# Patient Record
Sex: Female | Born: 1990 | Race: Black or African American | Hispanic: No | Marital: Single | Smoking: Current every day smoker
Health system: Southern US, Community
[De-identification: ages and names within clinical notes are randomized; demographics above are authoritative.]

## PROBLEM LIST (undated history)

## (undated) ENCOUNTER — Inpatient Hospital Stay (HOSPITAL_COMMUNITY): Payer: Self-pay

## (undated) DIAGNOSIS — T7840XA Allergy, unspecified, initial encounter: Secondary | ICD-10-CM

## (undated) DIAGNOSIS — O24419 Gestational diabetes mellitus in pregnancy, unspecified control: Secondary | ICD-10-CM

## (undated) HISTORY — DX: Gestational diabetes mellitus in pregnancy, unspecified control: O24.419

## (undated) HISTORY — DX: Allergy, unspecified, initial encounter: T78.40XA

---

## 2010-07-02 ENCOUNTER — Emergency Department: Payer: Self-pay | Admitting: Internal Medicine

## 2012-02-20 ENCOUNTER — Emergency Department: Payer: Self-pay | Admitting: Emergency Medicine

## 2014-09-03 ENCOUNTER — Emergency Department: Payer: Self-pay | Admitting: Emergency Medicine

## 2014-09-03 ENCOUNTER — Emergency Department: Payer: Self-pay | Admitting: General Practice

## 2014-09-09 ENCOUNTER — Emergency Department: Payer: Self-pay | Admitting: Emergency Medicine

## 2015-04-11 ENCOUNTER — Emergency Department: Admit: 2015-04-11 | Payer: Self-pay | Admitting: General Practice

## 2015-12-18 HISTORY — PX: WISDOM TOOTH EXTRACTION: SHX21

## 2016-02-01 ENCOUNTER — Ambulatory Visit (INDEPENDENT_AMBULATORY_CARE_PROVIDER_SITE_OTHER): Payer: BLUE CROSS/BLUE SHIELD | Admitting: Physician Assistant

## 2016-02-01 VITALS — BP 110/58 | HR 77 | Temp 98.3°F | Resp 14 | Ht 65.0 in | Wt 201.2 lb

## 2016-02-01 DIAGNOSIS — B309 Viral conjunctivitis, unspecified: Secondary | ICD-10-CM | POA: Diagnosis not present

## 2016-02-01 DIAGNOSIS — A599 Trichomoniasis, unspecified: Secondary | ICD-10-CM | POA: Diagnosis not present

## 2016-02-01 DIAGNOSIS — N898 Other specified noninflammatory disorders of vagina: Secondary | ICD-10-CM

## 2016-02-01 DIAGNOSIS — R350 Frequency of micturition: Secondary | ICD-10-CM

## 2016-02-01 LAB — POCT URINALYSIS DIP (MANUAL ENTRY)
Bilirubin, UA: NEGATIVE
Glucose, UA: NEGATIVE
Ketones, POC UA: NEGATIVE
Nitrite, UA: NEGATIVE
Protein Ur, POC: NEGATIVE
Spec Grav, UA: 1.025
Urobilinogen, UA: 0.2
pH, UA: 6

## 2016-02-01 LAB — POCT WET + KOH PREP
Yeast by KOH: ABSENT
Yeast by wet prep: ABSENT

## 2016-02-01 LAB — POC MICROSCOPIC URINALYSIS (UMFC): Trichomonas, UA: POSITIVE

## 2016-02-01 LAB — POCT URINE PREGNANCY: Preg Test, Ur: NEGATIVE

## 2016-02-01 LAB — HIV ANTIBODY (ROUTINE TESTING W REFLEX): HIV 1&2 Ab, 4th Generation: NONREACTIVE

## 2016-02-01 MED ORDER — POLYMYXIN B-TRIMETHOPRIM 10000-0.1 UNIT/ML-% OP SOLN: 1.0000 [drp] | OPHTHALMIC | Status: DC

## 2016-02-01 MED ORDER — METRONIDAZOLE 500 MG PO TABS: 2000.0000 mg | ORAL_TABLET | Freq: Three times a day (TID) | ORAL | Status: DC

## 2016-02-01 NOTE — Progress Notes (Signed)
02/01/2016 10:34 AM   DOB: 1991-03-18 / MRN: [id number]  SUBJECTIVE:  Linda Reyes is a 25 y.o. female presenting for vaginal discharge that started 4 days ago, report the discharge is whitish and associates vaginal itching.  Has had BV and yeast before.  Has a new sexual partner and reports the condom did not fail.  She is not taking birth control and wants to have a baby.  Complains that her eye has become red starting yesterday and has had copious tearing.  Denies photophobia, nausea, changes in vision, HA.  No facial tenderness.  Feels this problem is getting better.  Has tried Visine.  She wears contacts however has taken these out since the onset of this illness.       She has No Known Allergies.   She  has a past medical history of Allergy.    She  reports that she has been smoking.  She does not have any smokeless tobacco history on file. She  has no sexual activity history on file. The patient  has no past surgical history on file.  Her family history is not on file.  Review of Systems  Constitutional: Negative for fever and chills.  Eyes: Negative for blurred vision.  Respiratory: Negative for cough and shortness of breath.   Cardiovascular: Negative for chest pain.  Gastrointestinal: Negative for nausea and abdominal pain.  Genitourinary: Positive for frequency. Negative for dysuria, urgency, hematuria and flank pain.  Musculoskeletal: Negative for myalgias.  Skin: Negative for rash.  Neurological: Negative for dizziness, tingling and headaches.  Psychiatric/Behavioral: Negative for depression. The patient is not nervous/anxious.     Problem list and medications reviewed and updated by myself where necessary, and exist elsewhere in the encounter.   OBJECTIVE:  BP 110/58 mmHg  Pulse 77  Temp(Src) 98.3 F (36.8 C) (Oral)  Resp 14  Ht  (1.651 m)  Wt 201 lb 3.2 oz (91.264 kg)  BMI 33.48 kg/m2  SpO2 97%  LMP 01/29/2016  Physical Exam  Constitutional: She is  oriented to person, place, and time. She appears well-nourished. No distress.  Eyes: EOM are normal. Pupils are equal, round, and reactive to light.    Cardiovascular: Normal rate.   Pulmonary/Chest: Effort normal.  Abdominal: She exhibits no distension.  Genitourinary: Vagina normal. Cervix exhibits discharge. Cervix exhibits no motion tenderness and no friability.    Neurological: She is alert and oriented to person, place, and time. No cranial nerve deficit. Gait normal.  Skin: Skin is dry. She is not diaphoretic.  Psychiatric: She has a normal mood and affect.  Vitals reviewed.   Results for orders placed or performed in visit on 02/01/16 (from the past 72 hour(s))  POCT urine pregnancy     Status: None   Collection Time: 02/01/16 10:05 AM  Result Value Ref Range   Preg Test, Ur Negative Negative  POCT Microscopic Urinalysis ([facility])     Status: Abnormal   Collection Time: 02/01/16 10:05 AM  Result Value Ref Range   WBC,UR,HPF,POC Many (A) None WBC/hpf   RBC,UR,HPF,POC Few (A) None RBC/hpf   Bacteria Few (A) None, Too numerous to count   Mucus Present (A) Absent   Epithelial Cells, UR Per Microscopy Few (A) None, Too numerous to count cells/hpf   Trichomonas, UA Positive   POCT urinalysis dipstick     Status: Abnormal   Collection Time: 02/01/16 10:05 AM  Result Value Ref Range   Color, UA yellow yellow   Clarity, UA  clear clear   Glucose, UA negative negative   Bilirubin, UA negative negative   Ketones, POC UA negative negative   Spec Grav, UA 1.025    Blood, UA small (A) negative   pH, UA 6.0    Protein Ur, POC negative negative   Urobilinogen, UA 0.2    Nitrite, UA Negative Negative   Leukocytes, UA small (1+) (A) Negative  POCT Wet + KOH Prep     Status: None   Collection Time: 02/01/16 10:26 AM  Result Value Ref Range   Yeast by KOH Absent Present, Absent   Yeast by wet prep Absent Present, Absent   WBC by wet prep Few None, Few, Too numerous to count   Clue  Cells Wet Prep HPF POC None None, Too numerous to count   Trich by wet prep Present Present, Absent   Bacteria Wet Prep HPF POC Few None, Few, Too numerous to count   Epithelial Cells By [employer] ([facility]) Few None, Few, Too numerous to count   RBC,UR,HPF,POC None None RBC/hpf    No results found.  ASSESSMENT AND PLAN  Linda Reyes was seen today for conjunctivitis, urinary frequency and vaginal discharge.  Diagnoses and all orders for this visit:  Urinary frequency -     POCT urine pregnancy -     POCT Microscopic Urinalysis ([facility]) -     POCT urinalysis dipstick  Vaginal discharge -     POCT Wet + KOH Prep -     HIV antibody -     RPR -     Pap IG, CT/NG w/ reflex HPV when ASC-U  Viral conjunctivitis: Her vision is 20/40 without correction by my exam. Her symptoms are improving without therapy.  Advised she give this more time to go away on its own.  Advised she keep her contacts out and to start abx in 48 hours if her symptoms do not resolve.  RTC ASAP if her symptoms worsen.   -     trimethoprim-polymyxin b (POLYTRIM) ophthalmic solution; Place 1 drop into the left eye every 4 (four) hours.  Trichomonas vaginalis infection: Pharmacy notified to change sig to take 4 pills once.  -     metroNIDAZOLE (FLAGYL) 500 MG tablet; Take 4 tablets (2,000 mg total) by mouth 3 (three) times daily.    The patient was advised to call or return to clinic if she does not see an improvement in symptoms or to seek the care of the closest emergency department if she worsens with the above plan.   Deliah Boston, MHS, PA-C [facility] 02/01/2016 10:34 AM

## 2016-02-02 ENCOUNTER — Encounter: Payer: Self-pay | Admitting: *Deleted

## 2016-02-02 LAB — PAP IG, CT-NG, RFX HPV ASCU
Chlamydia Probe Amp: NOT DETECTED
GC Probe Amp: NOT DETECTED

## 2016-02-02 LAB — RPR

## 2016-09-19 ENCOUNTER — Ambulatory Visit: Payer: BLUE CROSS/BLUE SHIELD

## 2016-11-24 ENCOUNTER — Ambulatory Visit (HOSPITAL_COMMUNITY)
Admission: EM | Admit: 2016-11-24 | Discharge: 2016-11-24 | Disposition: A | Discharge status: Other Acute Inpatient Hospital | Payer: BLUE CROSS/BLUE SHIELD

## 2017-05-19 ENCOUNTER — Encounter (HOSPITAL_COMMUNITY): Payer: Self-pay | Admitting: Emergency Medicine

## 2017-05-19 ENCOUNTER — Emergency Department (HOSPITAL_COMMUNITY): Payer: Medicaid Other

## 2017-05-19 ENCOUNTER — Emergency Department (HOSPITAL_COMMUNITY)
Admission: EM | Admit: 2017-05-19 | Discharge: 2017-05-19 | Disposition: A | Discharge status: Home / Self Care | Payer: Medicaid Other | Attending: Emergency Medicine | Admitting: Emergency Medicine

## 2017-05-19 DIAGNOSIS — Z87891 Personal history of nicotine dependence: Secondary | ICD-10-CM | POA: Insufficient documentation

## 2017-05-19 DIAGNOSIS — Z3490 Encounter for supervision of normal pregnancy, unspecified, unspecified trimester: Secondary | ICD-10-CM

## 2017-05-19 DIAGNOSIS — O26891 Other specified pregnancy related conditions, first trimester: Secondary | ICD-10-CM | POA: Diagnosis not present

## 2017-05-19 DIAGNOSIS — Z3A00 Weeks of gestation of pregnancy not specified: Secondary | ICD-10-CM | POA: Insufficient documentation

## 2017-05-19 DIAGNOSIS — R1030 Lower abdominal pain, unspecified: Secondary | ICD-10-CM

## 2017-05-19 LAB — COMPREHENSIVE METABOLIC PANEL
ALT: 9 U/L — ABNORMAL LOW (ref 14–54)
AST: 16 U/L (ref 15–41)
Albumin: 3.6 g/dL (ref 3.5–5.0)
Alkaline Phosphatase: 56 U/L (ref 38–126)
Anion gap: 10 (ref 5–15)
BUN: 5 mg/dL — ABNORMAL LOW (ref 6–20)
CO2: 22 mmol/L (ref 22–32)
Calcium: 9.5 mg/dL (ref 8.9–10.3)
Chloride: 99 mmol/L — ABNORMAL LOW (ref 101–111)
Creatinine, Ser: 0.67 mg/dL (ref 0.44–1.00)
GFR calc Af Amer: >60 mL/min (ref >60)
GFR calc non Af Amer: >60 mL/min (ref >60)
Glucose, Bld: 105 mg/dL — ABNORMAL HIGH (ref 65–99)
Potassium: 3.7 mmol/L (ref 3.5–5.1)
Sodium: 131 mmol/L — ABNORMAL LOW (ref 135–145)
Total Bilirubin: 1 mg/dL (ref 0.3–1.2)
Total Protein: 7.4 g/dL (ref 6.5–8.1)

## 2017-05-19 LAB — URINALYSIS, ROUTINE W REFLEX MICROSCOPIC
Bilirubin Urine: NEGATIVE
Glucose, UA: NEGATIVE mg/dL
Hgb urine dipstick: NEGATIVE
Ketones, ur: NEGATIVE mg/dL
Nitrite: NEGATIVE
Protein, ur: NEGATIVE mg/dL
Specific Gravity, Urine: 1.009 (ref 1.005–1.030)
pH: 7 (ref 5.0–8.0)

## 2017-05-19 LAB — CBC
HCT: 40.4 % (ref 36.0–46.0)
Hemoglobin: 14.1 g/dL (ref 12.0–15.0)
MCH: 31.4 pg (ref 26.0–34.0)
MCHC: 34.9 g/dL (ref 30.0–36.0)
MCV: 90 fL (ref 78.0–100.0)
Platelets: 322 10*3/uL (ref 150–400)
RBC: 4.49 MIL/uL (ref 3.87–5.11)
RDW: 11.8 % (ref 11.5–15.5)
WBC: 9.7 10*3/uL (ref 4.0–10.5)

## 2017-05-19 LAB — LIPASE, BLOOD: Lipase: 23 U/L (ref 11–51)

## 2017-05-19 LAB — HCG, QUANTITATIVE, PREGNANCY: hCG, Beta Chain, Quant, S: 92321 m[IU]/mL — ABNORMAL HIGH (ref <5)

## 2017-05-19 MED ORDER — ACETAMINOPHEN 500 MG PO TABS
1000.0000 mg | ORAL_TABLET | Freq: Once | ORAL | Status: AC
  Administered 2017-05-19: 1000 mg via ORAL
  Filled 2017-05-19: qty 2

## 2017-05-19 NOTE — ED Triage Notes (Signed)
Reports nausea and vomiting X 1 started 3 days ago with abdominal pain on left side.  Reports that she may be pregnant.  Last period April 11th.

## 2017-05-19 NOTE — ED Notes (Signed)
Pt returns from ultrasound

## 2017-05-19 NOTE — ED Provider Notes (Signed)
[facility]-EMERGENCY DEPT Provider Note   CSN: [id number] Arrival date & time: 05/19/17  0546     History   Chief Complaint Chief Complaint  Patient presents with  . Emesis  . Abdominal Pain    HPI Linda Reyes is a 26 y.o. female.  HPI Patient presents to the emergency department with lower abdominal discomfort.  The patient states that she is concerned she may be pregnant.  She took 3 home pregnancy tests were positive.  The patient states her last normal period was in April.  The patient states that nothing seems make the condition better or worse.  She states that she has also had intermittent vomiting. The patient denies chest pain, shortness of breath, headache,blurred vision, neck pain, fever, cough, weakness, numbness, dizziness, anorexia, edema,diarrhea, rash, back pain, dysuria, hematemesis, bloody stool, near syncope, or syncope. Past Medical History:  Diagnosis Date  . Allergy     There are no active problems to display for this patient.   History reviewed. No pertinent surgical history.  OB History    No data available       Home Medications    Prior to Admission medications   Not on File    Family History No family history on file.  Social History Social History  Substance Use Topics  . Smoking status: Former Smoker    Quit date: 05/13/2017  . Smokeless tobacco: Never Used  . Alcohol use 0.0 oz/week     Comment: occasionally     Allergies   Patient has no known allergies.   Review of Systems Review of Systems  All other systems negative except as documented in the HPI. All pertinent positives and negatives as reviewed in the HPI. Physical Exam Updated Vital Signs BP 114/67 (BP Location: Left Arm)   Pulse 67   Temp 97.9 F (36.6 C) (Oral)   Resp 17   Ht 5\' 5"  (1.651 m)   Wt 82.1 kg (181 lb)   LMP 03/27/2017   SpO2 99%   BMI 30.12 kg/m   Physical Exam  Constitutional: She is oriented to person, place, and time. She appears  well-developed and well-nourished. No distress.  HENT:  Head: Normocephalic and atraumatic.  Mouth/Throat: Oropharynx is clear and moist.  Eyes: Pupils are equal, round, and reactive to light.  Neck: Normal range of motion. Neck supple.  Cardiovascular: Normal rate, regular rhythm and normal heart sounds.  Exam reveals no gallop and no friction rub.   No murmur heard. Pulmonary/Chest: Effort normal and breath sounds normal. No respiratory distress. She has no wheezes.  Abdominal: Soft. Bowel sounds are normal. She exhibits no distension and no mass. There is tenderness. There is no rebound and no guarding.  Neurological: She is alert and oriented to person, place, and time. She exhibits normal muscle tone. Coordination normal.  Skin: Skin is warm and dry. Capillary refill takes less than 2 seconds. No rash noted. No erythema.  Psychiatric: She has a normal mood and affect. Her behavior is normal.  Nursing note and vitals reviewed.    ED Treatments / Results  Labs (all labs ordered are listed, but only abnormal results are displayed) Labs Reviewed  COMPREHENSIVE METABOLIC PANEL - Abnormal; Notable for the following:       Result Value   Sodium 131 (*)    Chloride 99 (*)    Glucose, Bld 105 (*)    BUN 5 (*)    ALT 9 (*)    All other components within normal  limits  URINALYSIS, ROUTINE W REFLEX MICROSCOPIC - Abnormal; Notable for the following:    Leukocytes, UA SMALL (*)    Bacteria, UA RARE (*)    Squamous Epithelial / LPF 0-5 (*)    All other components within normal limits  HCG, QUANTITATIVE, PREGNANCY - Abnormal; Notable for the following:    hCG, Beta Chain, Quant, S 92,321 (*)    All other components within normal limits  LIPASE, BLOOD  CBC    EKG  EKG Interpretation None       Radiology US Ob Comp Less 14 Wks  Result Date: 05/19/2017 CLINICAL DATA:  Pelvic pain for 3 days with positive pregnancy test. EXAM: OBSTETRIC <14 WK [place] AND TRANSVAGINAL OB US TECHNIQUE:  Both transabdominal and transvaginal ultrasound examinations were performed for complete evaluation of the gestation as well as the maternal uterus, adnexal regions, and pelvic cul-de-sac. Transvaginal technique was performed to assess early pregnancy. COMPARISON:  None. FINDINGS: Intrauterine gestational sac: Single. Yolk sac:  Visualized. Embryo:  Visualized. Cardiac Activity: Visualized. Heart Rate: 135  bpm CRL:  11  mm   7 w   1 d                  [place] EDC: 01/04/2018 Subchorionic hemorrhage:  None visualized. Maternal uterus/adnexae: Unremarkable. IMPRESSION: Single living intrauterine gestation at estimated 7 week 1 day gestational age by crown-rump length. Electronically Signed   By: Kennith Center M.D.   On: 05/19/2017 11:12   US Ob Transvaginal  Result Date: 05/19/2017 CLINICAL DATA:  Pelvic pain for 3 days with positive pregnancy test. EXAM: OBSTETRIC <14 WK [place] AND TRANSVAGINAL OB US TECHNIQUE: Both transabdominal and transvaginal ultrasound examinations were performed for complete evaluation of the gestation as well as the maternal uterus, adnexal regions, and pelvic cul-de-sac. Transvaginal technique was performed to assess early pregnancy. COMPARISON:  None. FINDINGS: Intrauterine gestational sac: Single. Yolk sac:  Visualized. Embryo:  Visualized. Cardiac Activity: Visualized. Heart Rate: 135  bpm CRL:  11  mm   7 w   1 d                  [place] EDC: 01/04/2018 Subchorionic hemorrhage:  None visualized. Maternal uterus/adnexae: Unremarkable. IMPRESSION: Single living intrauterine gestation at estimated 7 week 1 day gestational age by crown-rump length. Electronically Signed   By: Kennith Center M.D.   On: 05/19/2017 11:12    Procedures Procedures (including critical care time)  Medications Ordered in ED Medications  acetaminophen (TYLENOL) tablet 1,000 mg (1,000 mg Oral Given 05/19/17 0913)     Initial Impression / Assessment and Plan / ED Course  I have reviewed the triage vital signs and the  nursing notes.  Pertinent labs & imaging results that were available during my care of the patient were reviewed by me and considered in my medical decision making (see chart for details).     Patient be referred to [facility] hospital clinic for follow-up and evaluation.  The patient is single intrauterine pregnancy at this point, I advised patient to increase her fluid intake, rest, much possible.  Patient agrees the plan and all questions were answered  Final Clinical Impressions(s) / ED Diagnoses   Final diagnoses:  None    New Prescriptions New Prescriptions   No medications on file     Charlestine Night, Cordelia Poche 05/19/17 1132    Loren Racer, MD 05/23/17 [phone]

## 2017-05-19 NOTE — Discharge Instructions (Signed)
Return here as needed.  Follow-up with the clinic provided.  Tylenol for any pain

## 2017-05-19 NOTE — ED Triage Notes (Signed)
SN reported to PT and family member there will be a 1 Hr. Wait for US. Pt verbalizes understanding.

## 2017-06-01 ENCOUNTER — Inpatient Hospital Stay (HOSPITAL_COMMUNITY)
Admission: AD | Admit: 2017-06-01 | Discharge: 2017-06-01 | Disposition: A | Discharge status: Home / Self Care | Payer: Medicaid Other | Source: Ambulatory Visit | Attending: Obstetrics and Gynecology | Admitting: Obstetrics and Gynecology

## 2017-06-01 ENCOUNTER — Encounter (HOSPITAL_COMMUNITY): Payer: Self-pay | Admitting: *Deleted

## 2017-06-01 DIAGNOSIS — M79673 Pain in unspecified foot: Secondary | ICD-10-CM | POA: Insufficient documentation

## 2017-06-01 DIAGNOSIS — O9A211 Injury, poisoning and certain other consequences of external causes complicating pregnancy, first trimester: Secondary | ICD-10-CM | POA: Insufficient documentation

## 2017-06-01 DIAGNOSIS — W19XXXA Unspecified fall, initial encounter: Secondary | ICD-10-CM

## 2017-06-01 DIAGNOSIS — M7989 Other specified soft tissue disorders: Secondary | ICD-10-CM | POA: Insufficient documentation

## 2017-06-01 DIAGNOSIS — Z87891 Personal history of nicotine dependence: Secondary | ICD-10-CM | POA: Diagnosis not present

## 2017-06-01 DIAGNOSIS — N760 Acute vaginitis: Secondary | ICD-10-CM | POA: Insufficient documentation

## 2017-06-01 DIAGNOSIS — A599 Trichomoniasis, unspecified: Secondary | ICD-10-CM | POA: Diagnosis not present

## 2017-06-01 DIAGNOSIS — R1032 Left lower quadrant pain: Secondary | ICD-10-CM | POA: Diagnosis present

## 2017-06-01 DIAGNOSIS — R898 Other abnormal findings in specimens from other organs, systems and tissues: Secondary | ICD-10-CM | POA: Diagnosis present

## 2017-06-01 DIAGNOSIS — Z3A09 9 weeks gestation of pregnancy: Secondary | ICD-10-CM | POA: Insufficient documentation

## 2017-06-01 DIAGNOSIS — B9689 Other specified bacterial agents as the cause of diseases classified elsewhere: Secondary | ICD-10-CM | POA: Diagnosis not present

## 2017-06-01 DIAGNOSIS — O23591 Infection of other part of genital tract in pregnancy, first trimester: Secondary | ICD-10-CM | POA: Insufficient documentation

## 2017-06-01 LAB — WET PREP, GENITAL
Sperm: NONE SEEN
Yeast Wet Prep HPF POC: NONE SEEN

## 2017-06-01 MED ORDER — METRONIDAZOLE 500 MG PO TABS: 500.0000 mg | ORAL_TABLET | Freq: Two times a day (BID) | ORAL | 0 refills | Status: DC

## 2017-06-01 MED ORDER — PROMETHAZINE HCL 12.5 MG PO TABS: 12.5000 mg | ORAL_TABLET | Freq: Four times a day (QID) | ORAL | 0 refills | Status: DC | PRN

## 2017-06-01 MED ORDER — BUTALBITAL-APAP-CAFFEINE 50-325-40 MG PO TABS: 1.0000 | ORAL_TABLET | Freq: Four times a day (QID) | ORAL | 0 refills | Status: DC | PRN

## 2017-06-01 NOTE — Discharge Instructions (Signed)

## 2017-06-01 NOTE — MAU Provider Note (Signed)
History     CSN: [id number]  Arrival date and time: 06/01/17 1643   First Provider Initiated Contact with Patient 06/01/17 1703      Chief Complaint  Patient presents with  . Fall  . Foot Pain  . Abdominal Pain  . Vaginal Discharge   HPI Linda Reyes is a 26 y.o. G1P0 at [redacted]w[redacted]d who presents after a fall. She states she fell yesterday at 2pm, bent back her toe and fell on the couch. She did not hit her abdomen. She states she went to work today and her foot started swelling and she could not walk on it. She rates the pain a 7/10 and tried one tylenol for the pain with no relief. She is also having a pink discharge with lower left abdominal pain. IUP confirmed in the ED on 6/3.  OB History    Gravida Para Term Preterm AB Living   1             SAB TAB Ectopic Multiple Live Births                  Past Medical History:  Diagnosis Date  . Allergy     No past surgical history on file.  No family history on file.  Social History  Substance Use Topics  . Smoking status: Former Smoker    Quit date: 05/13/2017  . Smokeless tobacco: Never Used  . Alcohol use 0.0 oz/week     Comment: occasionally    Allergies: No Known Allergies  No prescriptions prior to admission.    Review of Systems  Constitutional: Negative for fatigue and fever.  Respiratory: Negative.  Negative for shortness of breath.   Cardiovascular: Negative.  Negative for chest pain.  Gastrointestinal: Positive for abdominal pain.  Genitourinary: Positive for vaginal discharge. Negative for vaginal bleeding.  Neurological: Negative.  Negative for headaches.   Physical Exam   Blood pressure 118/73, pulse 75, temperature 99.3 F (37.4 C), temperature source Oral, resp. rate 18, last menstrual period 03/27/2017.  Physical Exam  Nursing note and vitals reviewed. Constitutional: She appears well-developed and well-nourished.  HENT:  Head: Normocephalic and atraumatic.  Eyes: Conjunctivae are normal. No  scleral icterus.  Cardiovascular: Normal rate, regular rhythm and normal heart sounds.   Respiratory: Effort normal and breath sounds normal. No respiratory distress.  GI: She exhibits no distension. There is no tenderness. There is no guarding.  Genitourinary: Cervix exhibits no motion tenderness and no friability. No bleeding in the vagina. Vaginal discharge found.  Musculoskeletal: Normal range of motion. She exhibits tenderness.  Tenderness along 4th toe on left foot. Slight swelling noted. No discoloration. Warm to touch, pulses palpable  Neurological: She is alert.  Skin: Skin is warm and dry.  Psychiatric: She has a normal mood and affect. Her behavior is normal. Judgment and thought content normal.    MAU Course  Procedures Results for orders placed or performed during the hospital encounter of 06/01/17 (from the past 24 hour(s))  Wet prep, genital     Status: Abnormal   Collection Time: 06/01/17  5:15 PM  Result Value Ref Range   Yeast Wet Prep HPF POC NONE SEEN NONE SEEN   Trich, Wet Prep PRESENT (A) NONE SEEN   Clue Cells Wet Prep HPF POC PRESENT (A) NONE SEEN   WBC, Wet Prep HPF POC MODERATE (A) NONE SEEN   Sperm NONE SEEN    MDM UA, wet prep and gc/chlamydia Unable to leave sample for UA.  Assessment and Plan   1. Fall, initial encounter   2. Trichimoniasis    -Discharge patient home in stable condition -Prescription for phenergan and fioricet. -Flagyl prescribed for trichomoniasis and bacterial vaginosis -Counseled patient about abstaining from intercourse for 1 week after treatment and encouraged patient to have partner treated -Encouraged patient to go to orthopedic Monday morning if pain does not resolve. -Encouraged to return here or to other Urgent Care/ED if she develops worsening of symptoms, increase in pain, fever, or other concerning symptoms.   Cleone SlimCaroline Neill SNM 06/01/2017, 5:56 PM

## 2017-06-01 NOTE — MAU Note (Signed)
Patient presents s/p fall yesterday afternoon at 2 pm onto the couch, not in the floor, abdominal pain x 1 week, has not gotten worse since fall, last week started having pink discharge, but for past 2 days hasn't had any pink discharge, left foot/ankle swelling, when tripped at home 4th toe bent back.

## 2017-06-03 LAB — GC/CHLAMYDIA PROBE AMP (~~LOC~~) NOT AT ARMC
Chlamydia: NEGATIVE
Neisseria Gonorrhea: NEGATIVE

## 2017-06-10 ENCOUNTER — Inpatient Hospital Stay (HOSPITAL_COMMUNITY)
Admission: AD | Admit: 2017-06-10 | Discharge: 2017-06-10 | Disposition: A | Discharge status: Home / Self Care | Payer: Medicaid Other | Source: Ambulatory Visit | Attending: Obstetrics and Gynecology | Admitting: Obstetrics and Gynecology

## 2017-06-10 ENCOUNTER — Encounter (HOSPITAL_COMMUNITY): Payer: Self-pay | Admitting: *Deleted

## 2017-06-10 DIAGNOSIS — Z79899 Other long term (current) drug therapy: Secondary | ICD-10-CM | POA: Insufficient documentation

## 2017-06-10 DIAGNOSIS — Z3A10 10 weeks gestation of pregnancy: Secondary | ICD-10-CM | POA: Insufficient documentation

## 2017-06-10 DIAGNOSIS — O219 Vomiting of pregnancy, unspecified: Secondary | ICD-10-CM

## 2017-06-10 DIAGNOSIS — O210 Mild hyperemesis gravidarum: Secondary | ICD-10-CM | POA: Insufficient documentation

## 2017-06-10 DIAGNOSIS — O99281 Endocrine, nutritional and metabolic diseases complicating pregnancy, first trimester: Secondary | ICD-10-CM | POA: Insufficient documentation

## 2017-06-10 DIAGNOSIS — Z87891 Personal history of nicotine dependence: Secondary | ICD-10-CM | POA: Diagnosis not present

## 2017-06-10 DIAGNOSIS — E860 Dehydration: Secondary | ICD-10-CM

## 2017-06-10 LAB — CBC
HCT: 37.5 % (ref 36.0–46.0)
Hemoglobin: 13.3 g/dL (ref 12.0–15.0)
MCH: 31.8 pg (ref 26.0–34.0)
MCHC: 35.5 g/dL (ref 30.0–36.0)
MCV: 89.7 fL (ref 78.0–100.0)
Platelets: 344 10*3/uL (ref 150–400)
RBC: 4.18 MIL/uL (ref 3.87–5.11)
RDW: 12.1 % (ref 11.5–15.5)
WBC: 8.9 10*3/uL (ref 4.0–10.5)

## 2017-06-10 LAB — COMPREHENSIVE METABOLIC PANEL
ALT: 12 U/L — ABNORMAL LOW (ref 14–54)
AST: 18 U/L (ref 15–41)
Albumin: 3.9 g/dL (ref 3.5–5.0)
Alkaline Phosphatase: 51 U/L (ref 38–126)
Anion gap: 8 (ref 5–15)
BUN: 9 mg/dL (ref 6–20)
CO2: 23 mmol/L (ref 22–32)
Calcium: 9.5 mg/dL (ref 8.9–10.3)
Chloride: 100 mmol/L — ABNORMAL LOW (ref 101–111)
Creatinine, Ser: 0.5 mg/dL (ref 0.44–1.00)
GFR calc Af Amer: >60 mL/min (ref >60)
GFR calc non Af Amer: >60 mL/min (ref >60)
Glucose, Bld: 110 mg/dL — ABNORMAL HIGH (ref 65–99)
Potassium: 3.9 mmol/L (ref 3.5–5.1)
Sodium: 131 mmol/L — ABNORMAL LOW (ref 135–145)
Total Bilirubin: 0.4 mg/dL (ref 0.3–1.2)
Total Protein: 7.3 g/dL (ref 6.5–8.1)

## 2017-06-10 LAB — URINALYSIS, ROUTINE W REFLEX MICROSCOPIC
Bilirubin Urine: NEGATIVE
Glucose, UA: NEGATIVE mg/dL
Hgb urine dipstick: NEGATIVE
Ketones, ur: NEGATIVE mg/dL
Nitrite: NEGATIVE
Protein, ur: 30 mg/dL — AB
Specific Gravity, Urine: 1.032 — ABNORMAL HIGH (ref 1.005–1.030)
pH: 5 (ref 5.0–8.0)

## 2017-06-10 MED ORDER — PROMETHAZINE HCL 25 MG RE SUPP: 25.0000 mg | Freq: Four times a day (QID) | RECTAL | 0 refills | Status: DC | PRN

## 2017-06-10 MED ORDER — PROMETHAZINE HCL 25 MG/ML IJ SOLN
12.5000 mg | Freq: Once | INTRAMUSCULAR | Status: AC
  Administered 2017-06-10: 12.5 mg via INTRAVENOUS
  Filled 2017-06-10: qty 1

## 2017-06-10 MED ORDER — LACTATED RINGERS IV BOLUS (SEPSIS)
1000.0000 mL | Freq: Once | INTRAVENOUS | Status: AC
  Administered 2017-06-10: 1000 mL via INTRAVENOUS

## 2017-06-10 MED ORDER — DOXYLAMINE SUCCINATE (SLEEP) 25 MG PO TABS: 25.0000 mg | ORAL_TABLET | Freq: Every day | ORAL | 0 refills | Status: DC

## 2017-06-10 MED ORDER — VITAMIN B-6 50 MG PO TABS: 50.0000 mg | ORAL_TABLET | Freq: Three times a day (TID) | ORAL | 0 refills | Status: DC

## 2017-06-10 NOTE — MAU Provider Note (Signed)
History     CSN: [id number]  Arrival date and time: 06/10/17 1135   First Provider Initiated Contact with Patient 06/10/17 1303      Chief Complaint  Patient presents with  . Morning Sickness   G1 @10 .5 wks here with N/V. Sx started 4 days ago. She is unable to tolerate anything po. Prior to this she had nausea and occasional vomiting for which she was prescribed Phenergan. No fevers. No sick contacts. No diarrhea. No VB or pain.    OB History    Gravida Para Term Preterm AB Living   1             SAB TAB Ectopic Multiple Live Births                  Past Medical History:  Diagnosis Date  . Allergy     History reviewed. No pertinent surgical history.  History reviewed. No pertinent family history.  Social History  Substance Use Topics  . Smoking status: Former Smoker    Types: Cigarettes    Quit date: 05/13/2017  . Smokeless tobacco: Never Used  . Alcohol use 0.0 oz/week     Comment: occasionally    Allergies: No Known Allergies  Prescriptions Prior to Admission  Medication Sig Dispense Refill Last Dose  . butalbital-acetaminophen-caffeine (FIORICET, ESGIC) 50-325-40 MG tablet Take 1-2 tablets by mouth every 6 (six) hours as needed for headache. 20 tablet 0   . metroNIDAZOLE (FLAGYL) 500 MG tablet Take 1 tablet (500 mg total) by mouth 2 (two) times daily. 14 tablet 0   . promethazine (PHENERGAN) 12.5 MG tablet Take 1 tablet (12.5 mg total) by mouth every 6 (six) hours as needed for nausea or vomiting. 30 tablet 0     Review of Systems  Constitutional: Negative for fever.  Gastrointestinal: Positive for nausea and vomiting. Negative for abdominal pain and diarrhea.  Genitourinary: Negative for vaginal bleeding.   Physical Exam   Blood pressure 109/74, pulse 84, temperature 98.4 F (36.9 C), resp. rate 18, height 5\' 5"  (1.651 m), weight 79.4 kg (175 lb), last menstrual period 03/27/2017.  Physical Exam  Nursing note and vitals reviewed. Constitutional:  She is oriented to person, place, and time. She appears well-developed and well-nourished. No distress.  HENT:  Head: Normocephalic and atraumatic.  Neck: Normal range of motion.  Cardiovascular: Normal rate.   Respiratory: Effort normal.  Musculoskeletal: Normal range of motion.  Neurological: She is alert and oriented to person, place, and time.  Skin: Skin is warm and dry.  Psychiatric: She has a normal mood and affect.   Results for orders placed or performed during the hospital encounter of 06/10/17 (from the past 24 hour(s))  Urinalysis, Routine w reflex microscopic     Status: Abnormal   Collection Time: 06/10/17 12:00 PM  Result Value Ref Range   Color, Urine YELLOW YELLOW   APPearance HAZY (A) CLEAR   Specific Gravity, Urine 1.032 (H) 1.005 - 1.030   pH 5.0 5.0 - 8.0   Glucose, UA NEGATIVE NEGATIVE mg/dL   Hgb urine dipstick NEGATIVE NEGATIVE   Bilirubin Urine NEGATIVE NEGATIVE   Ketones, ur NEGATIVE NEGATIVE mg/dL   Protein, ur 30 (A) NEGATIVE mg/dL   Nitrite NEGATIVE NEGATIVE   Leukocytes, UA TRACE (A) NEGATIVE   RBC / HPF 0-5 0 - 5 RBC/hpf   WBC, UA 0-5 0 - 5 WBC/hpf   Bacteria, UA RARE (A) NONE SEEN   Squamous Epithelial / LPF 6-30 (A)  NONE SEEN   Mucous PRESENT   CBC     Status: None   Collection Time: 06/10/17 12:40 PM  Result Value Ref Range   WBC 8.9 4.0 - 10.5 K/uL   RBC 4.18 3.87 - 5.11 MIL/uL   Hemoglobin 13.3 12.0 - 15.0 g/dL   HCT 16.1 09.6 - 04.5 %   MCV 89.7 78.0 - 100.0 fL   MCH 31.8 26.0 - 34.0 pg   MCHC 35.5 30.0 - 36.0 g/dL   RDW 40.9 81.1 - 91.4 %   Platelets 344 150 - 400 K/uL  Comprehensive metabolic panel     Status: Abnormal   Collection Time: 06/10/17 12:40 PM  Result Value Ref Range   Sodium 131 (L) 135 - 145 mmol/L   Potassium 3.9 3.5 - 5.1 mmol/L   Chloride 100 (L) 101 - 111 mmol/L   CO2 23 22 - 32 mmol/L   Glucose, Bld 110 (H) 65 - 99 mg/dL   BUN 9 6 - 20 mg/dL   Creatinine, Ser 7.82 0.44 - 1.00 mg/dL   Calcium 9.5 8.9 - 95.6  mg/dL   Total Protein 7.3 6.5 - 8.1 g/dL   Albumin 3.9 3.5 - 5.0 g/dL   AST 18 15 - 41 U/L   ALT 12 (L) 14 - 54 U/L   Alkaline Phosphatase 51 38 - 126 U/L   Total Bilirubin 0.4 0.3 - 1.2 mg/dL   GFR calc non Af Amer >60 >60 mL/min   GFR calc Af Amer >60 >60 mL/min   Anion gap 8 5 - 15   MAU Course  Procedures LR 1 L Phenergan 25 mg IV  MDM Labs ordered and reviewed. Tolerating po after meds. No emesis. Stable for discharge home.   Assessment and Plan   1. [redacted] weeks gestation of pregnancy   2. Nausea/vomiting in pregnancy   3. Dehydration    Discharge home Follow up in [facility] next week as scheduled Rx Phenergan supp Rx Unisom & B6 BRAT diet Maintain hydration  Allergies as of 06/10/2017   No Known Allergies     Medication List    TAKE these medications   butalbital-acetaminophen-caffeine 50-325-40 MG tablet Commonly known as:  FIORICET, ESGIC Take 1-2 tablets by mouth every 6 (six) hours as needed for headache.   doxylamine (Sleep) 25 MG tablet Commonly known as:  UNISOM Take 1 tablet (25 mg total) by mouth at bedtime.   metroNIDAZOLE 500 MG tablet Commonly known as:  FLAGYL Take 1 tablet (500 mg total) by mouth 2 (two) times daily.   promethazine 12.5 MG tablet Commonly known as:  PHENERGAN Take 1 tablet (12.5 mg total) by mouth every 6 (six) hours as needed for nausea or vomiting. What changed:  Another medication with the same name was added. Make sure you understand how and when to take each.   promethazine 25 MG suppository Commonly known as:  PHENERGAN Place 1 suppository (25 mg total) rectally every 6 (six) hours as needed for nausea. What changed:  You were already taking a medication with the same name, and this prescription was added. Make sure you understand how and when to take each.   pyridOXINE 50 MG tablet Commonly known as:  VITAMIN B-6 Take 1 tablet (50 mg total) by mouth 3 (three) times daily.      Donette Larry, CNM 06/10/2017, 1:15 PM

## 2017-06-10 NOTE — MAU Note (Signed)
Pt presents to MAU with complaints of nausea and vomiting for 4 days. Pt was given a prescription for phenergan and flagyl for trich and BV on Saturday and has taken all but three pills but states that she has not kept anything down. Reports weight loss of 10 lbs

## 2017-06-10 NOTE — Discharge Instructions (Signed)
Morning Sickness Morning sickness is when you feel sick to your stomach (nauseous) during pregnancy. This nauseous feeling may or may not come with vomiting. It often occurs in the morning but can be a problem any time of day. Morning sickness is most common during the first trimester, but it may continue throughout pregnancy. While morning sickness is unpleasant, it is usually harmless unless you develop severe and continual vomiting (hyperemesis gravidarum). This condition requires more intense treatment. What are the causes? The cause of morning sickness is not completely known but seems to be related to normal hormonal changes that occur in pregnancy. What increases the risk? You are at greater risk if you:  Experienced nausea or vomiting before your pregnancy.  Had morning sickness during a previous pregnancy.  Are pregnant with more than one baby, such as twins.  How is this treated? Do not use any medicines (prescription, over-the-counter, or herbal) for morning sickness without first talking to your health care provider. Your health care provider may prescribe or recommend:  Vitamin B6 supplements.  Anti-nausea medicines.  The herbal medicine ginger.  Follow these instructions at home:  Only take over-the-counter or prescription medicines as directed by your health care provider.  Taking multivitamins before getting pregnant can prevent or decrease the severity of morning sickness in most women.  Eat a piece of dry toast or unsalted crackers before getting out of bed in the morning.  Eat five or six small meals a day.  Eat dry and bland foods (rice, baked potato). Foods high in carbohydrates are often helpful.  Do not drink liquids with your meals. Drink liquids between meals.  Avoid greasy, fatty, and spicy foods.  Get someone to cook for you if the smell of any food causes nausea and vomiting.  If you feel nauseous after taking prenatal vitamins, take the vitamins at  night or with a snack.  Snack on protein foods (nuts, yogurt, cheese) between meals if you are hungry.  Eat unsweetened gelatins for desserts.  Wearing an acupressure wristband (worn for sea sickness) may be helpful.  Acupuncture may be helpful.  Do not smoke.  Get a humidifier to keep the air in your house free of odors.  Get plenty of fresh air. Contact a health care provider if:  Your home remedies are not working, and you need medicine.  You feel dizzy or lightheaded.  You are losing weight. Get help right away if:  You have persistent and uncontrolled nausea and vomiting.  You pass out (faint). This information is not intended to replace advice given to you by your health care provider. Make sure you discuss any questions you have with your health care provider. Document Released: 01/24/2007 Document Revised: 05/10/2016 Document Reviewed: 05/20/2013 Elsevier Interactive Patient Education  2017 [employer] Inc. [employer] Diet A bland diet consists of foods that do not have a lot of fat or fiber. Foods without fat or fiber are easier for the body to digest. They are also less likely to irritate your mouth, throat, stomach, and other parts of your gastrointestinal tract. A bland diet is sometimes called a BRAT diet. What is my plan? Your health care provider or dietitian may recommend specific changes to your diet to prevent and treat your symptoms, such as:  Eating small meals often.  Cooking food until it is soft enough to chew easily.  Chewing your food well.  Drinking fluids slowly.  Not eating foods that are very spicy, sour, or fatty.  Not eating citrus  fruits, such as oranges and grapefruit.  What do I need to know about this diet?  Eat a variety of foods from the bland diet food list.  Do not follow a bland diet longer than you have to.  Ask your health care provider whether you should take vitamins. What foods can I eat? Grains  Hot cereals, such as cream  of wheat. Bread, crackers, or tortillas made from refined white flour. Rice. Vegetables Canned or cooked vegetables. Mashed or boiled potatoes. Fruits Bananas. Applesauce. Other types of cooked or canned fruit with the skin and seeds removed, such as canned peaches or pears. Meats and Other Protein Sources Scrambled eggs. Creamy peanut butter or other nut butters. Lean, well-cooked meats, such as chicken or fish. Tofu. Soups or broths. Dairy Low-fat dairy products, such as milk, cottage cheese, or yogurt. Beverages Water. Herbal tea. Apple juice. Sweets and Desserts Pudding. Custard. Fruit gelatin. Ice cream. Fats and Oils Mild salad dressings. Canola or olive oil. The items listed above may not be a complete list of allowed foods or beverages. Contact your dietitian for more options. What foods are not recommended? Foods and ingredients that are often not recommended include:  Spicy foods, such as hot sauce or salsa.  Fried foods.  Sour foods, such as pickled or fermented foods.  Raw vegetables or fruits, especially citrus or berries.  Caffeinated drinks.  Alcohol.  Strongly flavored seasonings or condiments.  The items listed above may not be a complete list of foods and beverages that are not allowed. Contact your dietitian for more information. This information is not intended to replace advice given to you by your health care provider. Make sure you discuss any questions you have with your health care provider. Document Released: 03/26/2016 Document Revised: 05/10/2016 Document Reviewed: 12/15/2014 Elsevier Interactive Patient Education  2018 [employer]

## 2017-06-18 ENCOUNTER — Encounter: Payer: Self-pay | Admitting: Obstetrics and Gynecology

## 2017-06-18 ENCOUNTER — Ambulatory Visit (INDEPENDENT_AMBULATORY_CARE_PROVIDER_SITE_OTHER): Payer: Medicaid Other | Admitting: Obstetrics and Gynecology

## 2017-06-18 ENCOUNTER — Other Ambulatory Visit (HOSPITAL_COMMUNITY)
Admission: RE | Admit: 2017-06-18 | Discharge: 2017-06-18 | Disposition: A | Discharge status: Home / Self Care | Payer: Medicaid Other | Source: Ambulatory Visit | Attending: Obstetrics and Gynecology | Admitting: Obstetrics and Gynecology

## 2017-06-18 DIAGNOSIS — Z3491 Encounter for supervision of normal pregnancy, unspecified, first trimester: Secondary | ICD-10-CM | POA: Insufficient documentation

## 2017-06-18 DIAGNOSIS — Z3490 Encounter for supervision of normal pregnancy, unspecified, unspecified trimester: Secondary | ICD-10-CM

## 2017-06-18 DIAGNOSIS — Z3401 Encounter for supervision of normal first pregnancy, first trimester: Secondary | ICD-10-CM | POA: Diagnosis not present

## 2017-06-18 DIAGNOSIS — O0993 Supervision of high risk pregnancy, unspecified, third trimester: Secondary | ICD-10-CM | POA: Insufficient documentation

## 2017-06-18 LAB — OB RESULTS CONSOLE HIV ANTIBODY (ROUTINE TESTING): HIV: NONREACTIVE

## 2017-06-18 MED ORDER — PREPLUS 27-1 MG PO TABS: 1.0000 | ORAL_TABLET | Freq: Every day | ORAL | 13 refills | Status: DC

## 2017-06-18 NOTE — Patient Instructions (Signed)
First Trimester of Pregnancy The first trimester of pregnancy is from week 1 until the end of week 13 (months 1 through 3). A week after a sperm fertilizes an egg, the egg will implant on the wall of the uterus. This embryo will begin to develop into a baby. Genes from you and your partner will form the baby. The female genes will determine whether the baby will be a boy or a girl. At 6-8 weeks, the eyes and face will be formed, and the heartbeat can be seen on ultrasound. At the end of 12 weeks, all the baby's organs will be formed. Now that you are pregnant, you will want to do everything you can to have a healthy baby. Two of the most important things are to get good prenatal care and to follow your health care provider's instructions. Prenatal care is all the medical care you receive before the baby's birth. This care will help prevent, find, and treat any problems during the pregnancy and childbirth. Body changes during your first trimester Your body goes through many changes during pregnancy. The changes vary from woman to woman.  You may gain or lose a couple of pounds at first.  You may feel sick to your stomach (nauseous) and you may throw up (vomit). If the vomiting is uncontrollable, call your health care provider.  You may tire easily.  You may develop headaches that can be relieved by medicines. All medicines should be approved by your health care provider.  You may urinate more often. Painful urination may mean you have a bladder infection.  You may develop heartburn as a result of your pregnancy.  You may develop constipation because certain hormones are causing the muscles that push stool through your intestines to slow down.  You may develop hemorrhoids or swollen veins (varicose veins).  Your breasts may begin to grow larger and become tender. Your nipples may stick out more, and the tissue that surrounds them (areola) may become darker.  Your gums may bleed and may be  sensitive to brushing and flossing.  Dark spots or blotches (chloasma, mask of pregnancy) may develop on your face. This will likely fade after the baby is born.  Your menstrual periods will stop.  You may have a loss of appetite.  You may develop cravings for certain kinds of food.  You may have changes in your emotions from day to day, such as being excited to be pregnant or being concerned that something may go wrong with the pregnancy and baby.  You may have more vivid and strange dreams.  You may have changes in your hair. These can include thickening of your hair, rapid growth, and changes in texture. Some women also have hair loss during or after pregnancy, or hair that feels dry or thin. Your hair will most likely return to normal after your baby is born.  What to expect at prenatal visits During a routine prenatal visit:  You will be weighed to make sure you and the baby are growing normally.  Your blood pressure will be taken.  Your abdomen will be measured to track your baby's growth.  The fetal heartbeat will be listened to between weeks 10 and 14 of your pregnancy.  Test results from any previous visits will be discussed.  Your health care provider may ask you:  How you are feeling.  If you are feeling the baby move.  If you have had any abnormal symptoms, such as leaking fluid, bleeding, severe headaches,   or abdominal cramping.  If you are using any tobacco products, including cigarettes, chewing tobacco, and electronic cigarettes.  If you have any questions.  Other tests that may be performed during your first trimester include:  Blood tests to find your blood type and to check for the presence of any previous infections. The tests will also be used to check for low iron levels (anemia) and protein on red blood cells (Rh antibodies). Depending on your risk factors, or if you previously had diabetes during pregnancy, you may have tests to check for high blood  sugar that affects pregnant women (gestational diabetes).  Urine tests to check for infections, diabetes, or protein in the urine.  An ultrasound to confirm the proper growth and development of the baby.  Fetal screens for spinal cord problems (spina bifida) and Down syndrome.  HIV (human immunodeficiency virus) testing. Routine prenatal testing includes screening for HIV, unless you choose not to have this test.  You may need other tests to make sure you and the baby are doing well.  Follow these instructions at home: Medicines  Follow your health care provider's instructions regarding medicine use. Specific medicines may be either safe or unsafe to take during pregnancy.  Take a prenatal vitamin that contains at least 600 micrograms (mcg) of folic acid.  If you develop constipation, try taking a stool softener if your health care provider approves. Eating and drinking  Eat a balanced diet that includes fresh fruits and vegetables, whole grains, good sources of protein such as meat, eggs, or tofu, and low-fat dairy. Your health care provider will help you determine the amount of weight gain that is right for you.  Avoid raw meat and uncooked cheese. These carry germs that can cause birth defects in the baby.  Eating four or five small meals rather than three large meals a day may help relieve nausea and vomiting. If you start to feel nauseous, eating a few soda crackers can be helpful. Drinking liquids between meals, instead of during meals, also seems to help ease nausea and vomiting.  Limit foods that are high in fat and processed sugars, such as fried and sweet foods.  To prevent constipation: ? Eat foods that are high in fiber, such as fresh fruits and vegetables, whole grains, and beans. ? Drink enough fluid to keep your urine clear or pale yellow. Activity  Exercise only as directed by your health care provider. Most women can continue their usual exercise routine during  pregnancy. Try to exercise for 30 minutes at least 5 days a week. Exercising will help you: ? Control your weight. ? Stay in shape. ? Be prepared for labor and delivery.  Experiencing pain or cramping in the lower abdomen or lower back is a good sign that you should stop exercising. Check with your health care provider before continuing with normal exercises.  Try to avoid standing for long periods of time. Move your legs often if you must stand in one place for a long time.  Avoid heavy lifting.  Wear low-heeled shoes and practice good posture.  You may continue to have sex unless your health care provider tells you not to. Relieving pain and discomfort  Wear a good support bra to relieve breast tenderness.  Take warm sitz baths to soothe any pain or discomfort caused by hemorrhoids. Use hemorrhoid cream if your health care provider approves.  Rest with your legs elevated if you have leg cramps or low back pain.  If you develop   varicose veins in your legs, wear support hose. Elevate your feet for 15 minutes, 3-4 times a day. Limit salt in your diet. Prenatal care  Schedule your prenatal visits by the twelfth week of pregnancy. They are usually scheduled monthly at first, then more often in the last 2 months before delivery.  Write down your questions. Take them to your prenatal visits.  Keep all your prenatal visits as told by your health care provider. This is important. Safety  Wear your seat belt at all times when driving.  Make a list of emergency phone numbers, including numbers for family, friends, the hospital, and police and fire departments. General instructions  Ask your health care provider for a referral to a local prenatal education class. Begin classes no later than the beginning of month 6 of your pregnancy.  Ask for help if you have counseling or nutritional needs during pregnancy. Your health care provider can offer advice or refer you to specialists for help  with various needs.  Do not use hot tubs, steam rooms, or saunas.  Do not douche or use tampons or scented sanitary pads.  Do not cross your legs for long periods of time.  Avoid cat litter boxes and soil used by cats. These carry germs that can cause birth defects in the baby and possibly loss of the fetus by miscarriage or stillbirth.  Avoid all smoking, herbs, alcohol, and medicines not prescribed by your health care provider. Chemicals in these products affect the formation and growth of the baby.  Do not use any products that contain nicotine or tobacco, such as cigarettes and e-cigarettes. If you need help quitting, ask your health care provider. You may receive counseling support and other resources to help you quit.  Schedule a dentist appointment. At home, brush your teeth with a soft toothbrush and be gentle when you floss. Contact a health care provider if:  You have dizziness.  You have mild pelvic cramps, pelvic pressure, or nagging pain in the abdominal area.  You have persistent nausea, vomiting, or diarrhea.  You have a bad smelling vaginal discharge.  You have pain when you urinate.  You notice increased swelling in your face, hands, legs, or ankles.  You are exposed to fifth disease or chickenpox.  You are exposed to German measles (rubella) and have never had it. Get help right away if:  You have a fever.  You are leaking fluid from your vagina.  You have spotting or bleeding from your vagina.  You have severe abdominal cramping or pain.  You have rapid weight gain or loss.  You vomit blood or material that looks like coffee grounds.  You develop a severe headache.  You have shortness of breath.  You have any kind of trauma, such as from a fall or a car accident. Summary  The first trimester of pregnancy is from week 1 until the end of week 13 (months 1 through 3).  Your body goes through many changes during pregnancy. The changes vary from  woman to woman.  You will have routine prenatal visits. During those visits, your health care provider will examine you, discuss any test results you may have, and talk with you about how you are feeling. This information is not intended to replace advice given to you by your health care provider. Make sure you discuss any questions you have with your health care provider. Document Released: 11/27/2001 Document Revised: 11/14/2016 Document Reviewed: 11/14/2016 Elsevier Interactive Patient Education  2017 Elsevier   Inc.  

## 2017-06-18 NOTE — Progress Notes (Signed)
Subjective:  Linda JollyCourtney Reyes is a 26 y.o. G1P0 at 2356w6d being seen today for first OB visit. She reports some nausea but this is improving. No chronic medical problems or meds. First pregnancy.   She is currently monitored for the following issues for this low-risk pregnancy and has Encounter for supervision of normal pregnancy, unspecified, unspecified trimester on her problem list.  Patient reports nausea.  Contractions: Not present. Vag. Bleeding: None.   . Denies leaking of fluid.   The following portions of the patient's history were reviewed and updated as appropriate: allergies, current medications, past family history, past medical history, past social history, past surgical history and problem list. Problem list updated.  Objective:   Vitals:   06/18/17 1416  BP: 111/73  Pulse: 83  Weight: 189 lb (85.7 kg)    Fetal Status: Fetal Heart Rate (bpm): 165         General:  Alert, oriented and cooperative. Patient is in no acute distress.  Skin: Skin is warm and dry. No rash noted.   Cardiovascular: Normal heart rate noted  Respiratory: Normal respiratory effort, no problems with respiration noted  Abdomen: Soft, gravid, appropriate for gestational age. Pain/Pressure: Absent     Pelvic:  Cervical exam performed        Extremities: Normal range of motion.  Edema: None  Mental Status: Normal mood and affect. Normal behavior. Normal judgment and thought content.  Breast sym supple no masses, nipple d/c or adenopathy  Urinalysis:      Assessment and Plan:  Pregnancy: G1P0 at 856w6d  1. Encounter for supervision of normal pregnancy, antepartum, unspecified gravidity Prenatal care and labs reviewed with pt - Culture, OB Urine - Hemoglobinopathy evaluation - Obstetric Panel, Including HIV - Cytology - PAP - Varicella zoster antibody, IgG - Cystic Fibrosis Mutation 97 - Prenatal Vit-Fe Fumarate-FA (PREPLUS) 27-1 MG TABS; Take 1 tablet by mouth daily.  Dispense: 30 tablet; Refill:  13  Preterm labor symptoms and general obstetric precautions including but not limited to vaginal bleeding, contractions, leaking of fluid and fetal movement were reviewed in detail with the patient. Please refer to After Visit Summary for other counseling recommendations.  Return in about 4 weeks (around 07/16/2017) for OB visit.   Hermina StaggersErvin, Michael L, MD

## 2017-06-20 LAB — CERVICOVAGINAL ANCILLARY ONLY
Bacterial vaginitis: NEGATIVE
Candida vaginitis: POSITIVE — AB
Chlamydia: NEGATIVE
Neisseria Gonorrhea: NEGATIVE
Trichomonas: POSITIVE — AB

## 2017-06-20 LAB — CYTOLOGY - PAP: Diagnosis: NEGATIVE

## 2017-06-20 LAB — URINE CULTURE, OB REFLEX

## 2017-06-20 LAB — CULTURE, OB URINE

## 2017-06-21 LAB — OBSTETRIC PANEL, INCLUDING HIV
Antibody Screen: NEGATIVE
Basophils Absolute: 0 10*3/uL (ref 0.0–0.2)
Basos: 0 %
EOS (ABSOLUTE): 0 10*3/uL (ref 0.0–0.4)
Eos: 1 %
HIV Screen 4th Generation wRfx: NONREACTIVE
Hematocrit: 35.7 % (ref 34.0–46.6)
Hemoglobin: 12.1 g/dL (ref 11.1–15.9)
Hepatitis B Surface Ag: NEGATIVE
Immature Grans (Abs): 0 10*3/uL (ref 0.0–0.1)
Immature Granulocytes: 0 %
Lymphocytes Absolute: 2.7 10*3/uL (ref 0.7–3.1)
Lymphs: 35 %
MCH: 30.9 pg (ref 26.6–33.0)
MCHC: 33.9 g/dL (ref 31.5–35.7)
MCV: 91 fL (ref 79–97)
Monocytes Absolute: 0.6 10*3/uL (ref 0.1–0.9)
Monocytes: 8 %
Neutrophils Absolute: 4.4 10*3/uL (ref 1.4–7.0)
Neutrophils: 56 %
Platelets: 341 10*3/uL (ref 150–379)
RBC: 3.92 x10E6/uL (ref 3.77–5.28)
RDW: 13.5 % (ref 12.3–15.4)
RPR Ser Ql: NONREACTIVE
Rh Factor: POSITIVE
Rubella Antibodies, IGG: 11 index (ref >0.99)
WBC: 7.8 10*3/uL (ref 3.4–10.8)

## 2017-06-21 LAB — HEMOGLOBINOPATHY EVALUATION
HGB C: 0 %
HGB S: 0 %
HGB VARIANT: 0 %
Hemoglobin A2 Quantitation: 2.4 % (ref 1.8–3.2)
Hemoglobin F Quantitation: 0 % (ref 0.0–2.0)
Hgb A: 97.6 % (ref 96.4–98.8)

## 2017-06-21 LAB — VARICELLA ZOSTER ANTIBODY, IGG: Varicella zoster IgG: 2835 index (ref >165)

## 2017-06-26 LAB — CYSTIC FIBROSIS MUTATION 97: Interpretation: NOT DETECTED

## 2017-07-15 ENCOUNTER — Encounter: Payer: Self-pay | Admitting: *Deleted

## 2017-07-15 ENCOUNTER — Ambulatory Visit (INDEPENDENT_AMBULATORY_CARE_PROVIDER_SITE_OTHER): Payer: Medicaid Other | Admitting: Obstetrics and Gynecology

## 2017-07-15 VITALS — BP 110/70 | HR 83 | Wt 195.0 lb

## 2017-07-15 DIAGNOSIS — Z3400 Encounter for supervision of normal first pregnancy, unspecified trimester: Secondary | ICD-10-CM

## 2017-07-15 DIAGNOSIS — Z3402 Encounter for supervision of normal first pregnancy, second trimester: Secondary | ICD-10-CM

## 2017-07-15 MED ORDER — VITAFOL GUMMIES 3.33-0.333-34.8 MG PO CHEW: 2.0000 | CHEWABLE_TABLET | Freq: Every day | ORAL | 6 refills | Status: DC

## 2017-07-15 NOTE — Addendum Note (Signed)
Addended by: Catalina AntiguaONSTANT, Marcea Rojek on: 07/15/2017 03:42 PM   Modules accepted: Orders

## 2017-07-15 NOTE — Progress Notes (Signed)
   PRENATAL VISIT NOTE  Subjective:  Linda Reyes is a 26 y.o. G1P0 at 1457w5d being seen today for ongoing prenatal care.  She is currently monitored for the following issues for this low-risk pregnancy and has Encounter for supervision of normal pregnancy, unspecified, unspecified trimester on her problem list.  Patient reports no complaints.  Contractions: Not present. Vag. Bleeding: None.   . Denies leaking of fluid.   The following portions of the patient's history were reviewed and updated as appropriate: allergies, current medications, past family history, past medical history, past social history, past surgical history and problem list. Problem list updated.  Objective:   Vitals:   07/15/17 1514  BP: 110/70  Pulse: 83  Weight: 195 lb (88.5 kg)    Fetal Status: Fetal Heart Rate (bpm): 154         General:  Alert, oriented and cooperative. Patient is in no acute distress.  Skin: Skin is warm and dry. No rash noted.   Cardiovascular: Normal heart rate noted  Respiratory: Normal respiratory effort, no problems with respiration noted  Abdomen: Soft, gravid, appropriate for gestational age.  Pain/Pressure: Absent     Pelvic: Cervical exam deferred        Extremities: Normal range of motion.  Edema: None  Mental Status:  Normal mood and affect. Normal behavior. Normal judgment and thought content.   Assessment and Plan:  Pregnancy: G1P0 at 5657w5d  1. Supervision of normal first pregnancy, antepartum Patient is doing well without complaints Anatomy ultrasound ordered Quad screen today - US MFM OB COMP + 14 WK; Future  Preterm labor symptoms and general obstetric precautions including but not limited to vaginal bleeding, contractions, leaking of fluid and fetal movement were reviewed in detail with the patient. Please refer to After Visit Summary for other counseling recommendations.  Return in about 4 weeks (around 08/12/2017) for ROB.   Catalina AntiguaPeggy Ralf Konopka, MD

## 2017-07-15 NOTE — Addendum Note (Signed)
Addended by: Dalphine HandingGARDNER, Jaxtyn Linville L on: 07/15/2017 04:50 PM   Modules accepted: Orders

## 2017-07-16 ENCOUNTER — Encounter: Payer: Medicaid Other | Admitting: Obstetrics and Gynecology

## 2017-07-17 ENCOUNTER — Other Ambulatory Visit: Payer: Self-pay | Admitting: *Deleted

## 2017-07-17 MED ORDER — VITAFOL GUMMIES 3.33-0.333-34.8 MG PO CHEW: 3.0000 | CHEWABLE_TABLET | Freq: Every day | ORAL | 6 refills | Status: AC

## 2017-07-17 NOTE — Progress Notes (Signed)
[facility] normal dose for Vitafol Gummies is 3 tabs per day. Updated Rx was requested. New Rx e-prescribed.

## 2017-07-19 LAB — AFP TETRA
DIA Mom Value: 1.33
DIA Value (EIA): 197.91 pg/mL
DSR (By Age)    1 IN: 924
DSR (Second Trimester) 1 IN: 1022
Gestational Age: 15.7 WEEKS
MSAFP Mom: 0.91
MSAFP: 26.3 ng/mL
MSHCG Mom: 2.03
MSHCG: 71899 m[IU]/mL
Maternal Age At EDD: 27 yr
Osb Risk: 10000
T18 (By Age): 1:3600 {titer}
Test Results:: NEGATIVE
Weight: 195 [lb_av]
uE3 Mom: 0.79
uE3 Value: 0.51 ng/mL

## 2017-08-09 ENCOUNTER — Ambulatory Visit (HOSPITAL_COMMUNITY): Payer: Medicaid Other

## 2017-08-09 ENCOUNTER — Ambulatory Visit (HOSPITAL_COMMUNITY)
Admission: RE | Admit: 2017-08-09 | Discharge: 2017-08-09 | Disposition: A | Discharge status: Home / Self Care | Payer: Medicaid Other | Source: Ambulatory Visit | Attending: Obstetrics and Gynecology | Admitting: Obstetrics and Gynecology

## 2017-08-09 DIAGNOSIS — Z3402 Encounter for supervision of normal first pregnancy, second trimester: Secondary | ICD-10-CM | POA: Diagnosis not present

## 2017-08-09 DIAGNOSIS — Z3400 Encounter for supervision of normal first pregnancy, unspecified trimester: Secondary | ICD-10-CM

## 2017-08-09 DIAGNOSIS — Z363 Encounter for antenatal screening for malformations: Secondary | ICD-10-CM | POA: Diagnosis not present

## 2017-08-12 ENCOUNTER — Ambulatory Visit (HOSPITAL_COMMUNITY): Payer: Medicaid Other

## 2017-08-14 ENCOUNTER — Ambulatory Visit (INDEPENDENT_AMBULATORY_CARE_PROVIDER_SITE_OTHER): Payer: Medicaid Other | Admitting: Obstetrics and Gynecology

## 2017-08-14 DIAGNOSIS — Z3400 Encounter for supervision of normal first pregnancy, unspecified trimester: Secondary | ICD-10-CM

## 2017-08-14 NOTE — Progress Notes (Signed)
   PRENATAL VISIT NOTE  Subjective:  Linda Reyes is a 26 y.o. G1P0 at 3757w0d being seen today for ongoing prenatal care.  She is currently monitored for the following issues for this low-risk pregnancy and has Encounter for supervision of normal pregnancy, unspecified, unspecified trimester on her problem list.  Patient reports no complaints.  Contractions: Not present. Vag. Bleeding: None.  Movement: Present. Denies leaking of fluid.   The following portions of the patient's history were reviewed and updated as appropriate: allergies, current medications, past family history, past medical history, past social history, past surgical history and problem list. Problem list updated.  Objective:   Vitals:   08/14/17 1635  BP: 127/77  Pulse: 100  Weight: 204 lb 4.8 oz (92.7 kg)    Fetal Status: Fetal Heart Rate (bpm): 154 Fundal Height: 21 cm Movement: Present     General:  Alert, oriented and cooperative. Patient is in no acute distress.  Skin: Skin is warm and dry. No rash noted.   Cardiovascular: Normal heart rate noted  Respiratory: Normal respiratory effort, no problems with respiration noted  Abdomen: Soft, gravid, appropriate for gestational age.  Pain/Pressure: Absent     Pelvic: Cervical exam deferred        Extremities: Normal range of motion.  Edema: None  Mental Status:  Normal mood and affect. Normal behavior. Normal judgment and thought content.   Assessment and Plan:  Pregnancy: G1P0 at 3457w0d  1. Supervision of normal first pregnancy, antepartum Patient is doing well without complaints Follow up ultrasound ordered - US MFM OB FOLLOW UP; Future  Preterm labor symptoms and general obstetric precautions including but not limited to vaginal bleeding, contractions, leaking of fluid and fetal movement were reviewed in detail with the patient. Please refer to After Visit Summary for other counseling recommendations.  Return in about 4 weeks (around 09/11/2017) for  ROB.   Catalina AntiguaPeggy Rhyann Berton, MD

## 2017-09-11 ENCOUNTER — Encounter: Payer: Self-pay | Admitting: *Deleted

## 2017-09-11 ENCOUNTER — Ambulatory Visit (INDEPENDENT_AMBULATORY_CARE_PROVIDER_SITE_OTHER): Payer: Medicaid Other | Admitting: Obstetrics & Gynecology

## 2017-09-11 DIAGNOSIS — Z3400 Encounter for supervision of normal first pregnancy, unspecified trimester: Secondary | ICD-10-CM

## 2017-09-11 DIAGNOSIS — Z3402 Encounter for supervision of normal first pregnancy, second trimester: Secondary | ICD-10-CM

## 2017-09-11 NOTE — Progress Notes (Signed)
   PRENATAL VISIT NOTE  Subjective:  Linda Reyes is a 26 y.o. G1P0 at [redacted]w[redacted]d being seen today for ongoing prenatal care.  She is currently monitored for the following issues for this low-risk pregnancy and has Encounter for supervision of normal pregnancy, unspecified, unspecified trimester on her problem list.  Patient reports no complaints.  Contractions: Not present. Vag. Bleeding: None.  Movement: Present. Denies leaking of fluid.   The following portions of the patient's history were reviewed and updated as appropriate: allergies, current medications, past family history, past medical history, past social history, past surgical history and problem list. Problem list updated.  Objective:   Vitals:   09/11/17 1623  BP: 112/69  Pulse: 96  Weight: 206 lb (93.4 kg)    Fetal Status: Fetal Heart Rate (bpm): 140 Fundal Height: 24 cm Movement: Present     General:  Alert, oriented and cooperative. Patient is in no acute distress.  Skin: Skin is warm and dry. No rash noted.   Cardiovascular: Normal heart rate noted  Respiratory: Normal respiratory effort, no problems with respiration noted  Abdomen: Soft, gravid, appropriate for gestational age.  Pain/Pressure: Absent     Pelvic: Cervical exam deferred        Extremities: Normal range of motion.  Edema: None  Mental Status:  Normal mood and affect. Normal behavior. Normal judgment and thought content.   Assessment and Plan:  Pregnancy: G1P0 at [redacted]w[redacted]d  2 hr GTT next visit, f/u US is tomorrow  Preterm labor symptoms and general obstetric precautions including but not limited to vaginal bleeding, contractions, leaking of fluid and fetal movement were reviewed in detail with the patient. Please refer to After Visit Summary for other counseling recommendations.  Return in about 4 weeks (around 10/09/2017).   Scheryl Darter, MD

## 2017-09-11 NOTE — Patient Instructions (Signed)

## 2017-09-20 ENCOUNTER — Ambulatory Visit (HOSPITAL_COMMUNITY)
Admission: RE | Admit: 2017-09-20 | Discharge: 2017-09-20 | Disposition: A | Discharge status: Home / Self Care | Payer: Medicaid Other | Source: Ambulatory Visit | Attending: Obstetrics and Gynecology | Admitting: Obstetrics and Gynecology

## 2017-09-20 ENCOUNTER — Other Ambulatory Visit: Payer: Self-pay | Admitting: Obstetrics and Gynecology

## 2017-09-20 DIAGNOSIS — Z362 Encounter for other antenatal screening follow-up: Secondary | ICD-10-CM | POA: Diagnosis not present

## 2017-09-20 DIAGNOSIS — Z3400 Encounter for supervision of normal first pregnancy, unspecified trimester: Secondary | ICD-10-CM

## 2017-09-20 DIAGNOSIS — Z3A25 25 weeks gestation of pregnancy: Secondary | ICD-10-CM | POA: Diagnosis not present

## 2017-10-09 ENCOUNTER — Other Ambulatory Visit: Payer: Medicaid Other

## 2017-10-09 ENCOUNTER — Encounter: Payer: Self-pay | Admitting: *Deleted

## 2017-10-09 ENCOUNTER — Ambulatory Visit (INDEPENDENT_AMBULATORY_CARE_PROVIDER_SITE_OTHER): Payer: Medicaid Other | Admitting: Obstetrics & Gynecology

## 2017-10-09 VITALS — BP 103/61 | HR 75 | Wt 213.8 lb

## 2017-10-09 DIAGNOSIS — Z3403 Encounter for supervision of normal first pregnancy, third trimester: Secondary | ICD-10-CM

## 2017-10-09 DIAGNOSIS — Z3400 Encounter for supervision of normal first pregnancy, unspecified trimester: Secondary | ICD-10-CM

## 2017-10-09 NOTE — Progress Notes (Signed)
ROB/GTT.  Declined TDAP. 

## 2017-10-09 NOTE — Patient Instructions (Signed)
Return to clinic for any scheduled appointments or obstetric concerns, or go to [facility] for evaluation  

## 2017-10-09 NOTE — Progress Notes (Signed)
   PRENATAL VISIT NOTE  Subjective:  Linda Reyes is a 26 y.o. G1P0 at 187w0d being seen today for ongoing prenatal care.  She is currently monitored for the following issues for this low-risk pregnancy and has Encounter for supervision of normal pregnancy, unspecified, unspecified trimester on her problem list.  Patient reports no complaints.  Contractions: Not present. Vag. Bleeding: None.  Movement: Present. Denies leaking of fluid.   The following portions of the patient's history were reviewed and updated as appropriate: allergies, current medications, past family history, past medical history, past social history, past surgical history and problem list. Problem list updated.  Objective:   Vitals:   10/09/17 0849  BP: 103/61  Pulse: 75  Weight: 213 lb 12.8 oz (97 kg)    Fetal Status: Fetal Heart Rate (bpm): 140 Fundal Height: 28 cm Movement: Present     General:  Alert, oriented and cooperative. Patient is in no acute distress.  Skin: Skin is warm and dry. No rash noted.   Cardiovascular: Normal heart rate noted  Respiratory: Normal respiratory effort, no problems with respiration noted  Abdomen: Soft, gravid, appropriate for gestational age.  Pain/Pressure: Absent     Pelvic: Cervical exam deferred        Extremities: Normal range of motion.  Edema: None  Mental Status:  Normal mood and affect. Normal behavior. Normal judgment and thought content.   Assessment and Plan:  Pregnancy: G1P0 at 3887w0d  1. Supervision of normal first pregnancy, antepartum Third trimester labs today. Declines vaccines. - Glucose Tolerance, 2 Hours w/1 Hour - CBC - HIV antibody (with reflex) - RPR Preterm labor symptoms and general obstetric precautions including but not limited to vaginal bleeding, contractions, leaking of fluid and fetal movement were reviewed in detail with the patient. Please refer to After Visit Summary for other counseling recommendations.  Return in about 2 weeks  (around 10/23/2017) for OB Visit.   Jaynie CollinsUgonna Hiilei Gerst, MD

## 2017-10-10 ENCOUNTER — Encounter: Payer: Self-pay | Admitting: Obstetrics & Gynecology

## 2017-10-10 DIAGNOSIS — O24419 Gestational diabetes mellitus in pregnancy, unspecified control: Secondary | ICD-10-CM | POA: Insufficient documentation

## 2017-10-10 LAB — CBC
Hematocrit: 32.1 % — ABNORMAL LOW (ref 34.0–46.6)
Hemoglobin: 10.3 g/dL — ABNORMAL LOW (ref 11.1–15.9)
MCH: 30.6 pg (ref 26.6–33.0)
MCHC: 32.1 g/dL (ref 31.5–35.7)
MCV: 95 fL (ref 79–97)
Platelets: 294 10*3/uL (ref 150–379)
RBC: 3.37 x10E6/uL — ABNORMAL LOW (ref 3.77–5.28)
RDW: 13.5 % (ref 12.3–15.4)
WBC: 8.2 10*3/uL (ref 3.4–10.8)

## 2017-10-10 LAB — HIV ANTIBODY (ROUTINE TESTING W REFLEX): HIV Screen 4th Generation wRfx: NONREACTIVE

## 2017-10-10 LAB — GLUCOSE TOLERANCE, 2 HOURS W/ 1HR
Glucose, 1 hour: 164 mg/dL (ref 65–179)
Glucose, 2 hour: 85 mg/dL (ref 65–152)
Glucose, Fasting: 93 mg/dL — ABNORMAL HIGH (ref 65–91)

## 2017-10-10 LAB — RPR: RPR Ser Ql: NONREACTIVE

## 2017-10-14 ENCOUNTER — Telehealth: Payer: Self-pay | Admitting: Pediatrics

## 2017-10-14 DIAGNOSIS — O24419 Gestational diabetes mellitus in pregnancy, unspecified control: Secondary | ICD-10-CM

## 2017-10-14 MED ORDER — ACCU-CHEK MULTICLIX LANCETS MISC: 12 refills | Status: DC

## 2017-10-14 MED ORDER — GLUCOSE BLOOD VI STRP: ORAL_STRIP | 12 refills | Status: DC

## 2017-10-14 MED ORDER — ACCU-CHEK GUIDE W/DEVICE KIT: 1.0000 | PACK | 0 refills | Status: DC

## 2017-10-14 NOTE — Telephone Encounter (Signed)
-----   Message from Tereso NewcomerUgonna A Anyanwu, MD sent at 10/10/2017 10:07 AM EDT ----- Patient's 2 hr GTT is abnormal and is consistent with GDM. She needs the following. [ ]  GDM education and testing supplies [ ]  Nutrition [ ]  Growth scan Please call to inform patient of results and recommendations.

## 2017-10-14 NOTE — Telephone Encounter (Signed)
LMTCB  Orders for GDM ed/supplies, nutrition, and scan entered and forwarded to pharmacy/[facility] for scheduling.

## 2017-10-15 NOTE — Telephone Encounter (Signed)
Pt called back in regards to results.  I advised of results and recommendations. Pt voiced understanding and agreed with plan.  US and ED sch.

## 2017-10-16 ENCOUNTER — Ambulatory Visit (HOSPITAL_COMMUNITY)
Admission: RE | Admit: 2017-10-16 | Discharge: 2017-10-16 | Disposition: A | Discharge status: Home / Self Care | Payer: Medicaid Other | Source: Ambulatory Visit | Attending: Obstetrics & Gynecology | Admitting: Obstetrics & Gynecology

## 2017-10-16 ENCOUNTER — Other Ambulatory Visit: Payer: Self-pay | Admitting: Obstetrics & Gynecology

## 2017-10-16 DIAGNOSIS — O24419 Gestational diabetes mellitus in pregnancy, unspecified control: Secondary | ICD-10-CM | POA: Insufficient documentation

## 2017-10-16 DIAGNOSIS — Z3A29 29 weeks gestation of pregnancy: Secondary | ICD-10-CM

## 2017-10-23 ENCOUNTER — Encounter: Payer: Self-pay | Admitting: *Deleted

## 2017-10-23 ENCOUNTER — Ambulatory Visit (INDEPENDENT_AMBULATORY_CARE_PROVIDER_SITE_OTHER): Payer: Medicaid Other | Admitting: Obstetrics & Gynecology

## 2017-10-23 VITALS — BP 115/71 | HR 92 | Wt 213.0 lb

## 2017-10-23 DIAGNOSIS — O24410 Gestational diabetes mellitus in pregnancy, diet controlled: Secondary | ICD-10-CM

## 2017-10-23 DIAGNOSIS — O0993 Supervision of high risk pregnancy, unspecified, third trimester: Secondary | ICD-10-CM

## 2017-10-23 NOTE — Progress Notes (Signed)
   PRENATAL VISIT NOTE  Subjective:  Linda Reyes is a 26 y.o. G1P0 at 6257w0d being seen today for ongoing prenatal care.  She is currently monitored for the following issues for this high-risk pregnancy and has Supervision of high risk pregnancy, antepartum, third trimester and Gestational diabetes mellitus, antepartum on their problem list.  Patient reports no complaints.  Contractions: Not present. Vag. Bleeding: None.  Movement: Present. Denies leaking of fluid.   The following portions of the patient's history were reviewed and updated as appropriate: allergies, current medications, past family history, past medical history, past social history, past surgical history and problem list. Problem list updated.  Objective:   Vitals:   10/23/17 1628  BP: 115/71  Pulse: 92  Weight: 213 lb (96.6 kg)    Fetal Status:     Movement: Present     General:  Alert, oriented and cooperative. Patient is in no acute distress.  Skin: Skin is warm and dry. No rash noted.   Cardiovascular: Normal heart rate noted  Respiratory: Normal respiratory effort, no problems with respiration noted  Abdomen: Soft, gravid, appropriate for gestational age.  Pain/Pressure: Absent     Pelvic: Cervical exam deferred        Extremities: Normal range of motion.  Edema: Trace  Mental Status:  Normal mood and affect. Normal behavior. Normal judgment and thought content.   Assessment and Plan:  Pregnancy: G1P0 at 5157w0d  1. Supervision of high risk pregnancy, antepartum, third trimester Growth US 10/31 nl   2. Diet controlled gestational diabetes mellitus (GDM), antepartum States FBS value on 2 hr was 6 hr after a milkshake therefore was not fasting, the rest of the values were normal. With a spurious result she is likely not diabetic but I offered repeating a FBS next visit which she will consider. No diabetes education at this time  Preterm  labor symptoms and general obstetric precautions including but not  limited to vaginal bleeding, contractions, leaking of fluid and fetal movement were reviewed in detail with the patient. Please refer to After Visit Summary for other counseling recommendations.  Return in about 2 weeks (around 11/06/2017).   Scheryl DarterJames Arnold, MD

## 2017-10-23 NOTE — Patient Instructions (Signed)

## 2017-11-06 ENCOUNTER — Ambulatory Visit (INDEPENDENT_AMBULATORY_CARE_PROVIDER_SITE_OTHER): Payer: Medicaid Other | Admitting: Obstetrics & Gynecology

## 2017-11-06 VITALS — BP 108/67 | HR 87 | Wt 214.0 lb

## 2017-11-06 DIAGNOSIS — O24410 Gestational diabetes mellitus in pregnancy, diet controlled: Secondary | ICD-10-CM

## 2017-11-06 DIAGNOSIS — O0993 Supervision of high risk pregnancy, unspecified, third trimester: Secondary | ICD-10-CM

## 2017-11-06 NOTE — Progress Notes (Signed)
Has not picked up testing supplies.  Wants to have random cbg and willing to come in for fasting.  Had a milkshake at 2am on the day of her 2hrGTT test.

## 2017-11-06 NOTE — Patient Instructions (Signed)

## 2017-11-06 NOTE — Progress Notes (Signed)
   PRENATAL VISIT NOTE  Subjective:  Linda Reyes is a 26 y.o. G1P0 at 10981w0d being seen today for ongoing prenatal care.  She is currently monitored for the following issues for this low-risk pregnancy and has Supervision of high risk pregnancy, antepartum, third trimester and Gestational diabetes mellitus, antepartum on their problem list.  Patient reports no complaints.  Contractions: Not present. Vag. Bleeding: None.  Movement: Present. Denies leaking of fluid.   The following portions of the patient's history were reviewed and updated as appropriate: allergies, current medications, past family history, past medical history, past social history, past surgical history and problem list. Problem list updated.  Objective:   Vitals:   11/06/17 1624  BP: 108/67  Pulse: 87  Weight: 214 lb (97.1 kg)    Fetal Status: Fetal Heart Rate (bpm): 133   Movement: Present     General:  Alert, oriented and cooperative. Patient is in no acute distress.  Skin: Skin is warm and dry. No rash noted.   Cardiovascular: Normal heart rate noted  Respiratory: Normal respiratory effort, no problems with respiration noted  Abdomen: Soft, gravid, appropriate for gestational age.  Pain/Pressure: Absent     Pelvic: Cervical exam deferred        Extremities: Normal range of motion.  Edema: Trace  Mental Status:  Normal mood and affect. Normal behavior. Normal judgment and thought content.   Assessment and Plan:  Pregnancy: G1P0 at 5281w0d  There are no diagnoses linked to this encounter. Preterm labor symptoms and general obstetric precautions including but not limited to vaginal bleeding, contractions, leaking of fluid and fetal movement were reviewed in detail with the patient. Please refer to After Visit Summary for other counseling recommendations.  Return in about 2 weeks (around 11/20/2017). May RTC for FBS  Scheryl DarterJames Odetta Forness, MD

## 2017-11-13 IMAGING — US US OB COMP LESS 14 WK
1 series · 14 of 28 positions shown · non-contrast
Comparison: None.

CLINICAL DATA: Pelvic pain for 3 days with positive pregnancy test.

EXAM:
OBSTETRIC <14 WK US AND TRANSVAGINAL OB US
TECHNIQUE: Both transabdominal and transvaginal ultrasound examinations were
performed for complete evaluation of the gestation as well as the
maternal uterus, adnexal regions, and pelvic cul-de-sac.
Transvaginal technique was performed to assess early pregnancy.

[Series 1: us ob comp less 14 wk · 0.15mm/px · 14 of 68 slices shown]
[im 3/68]
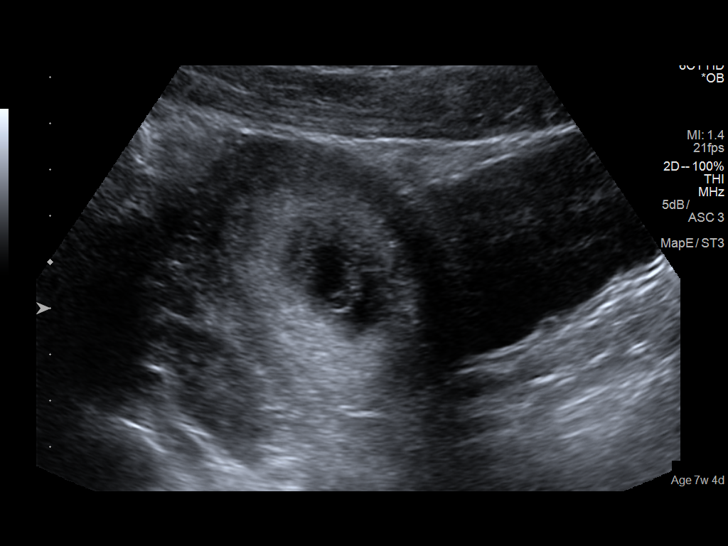
[im 8/68]
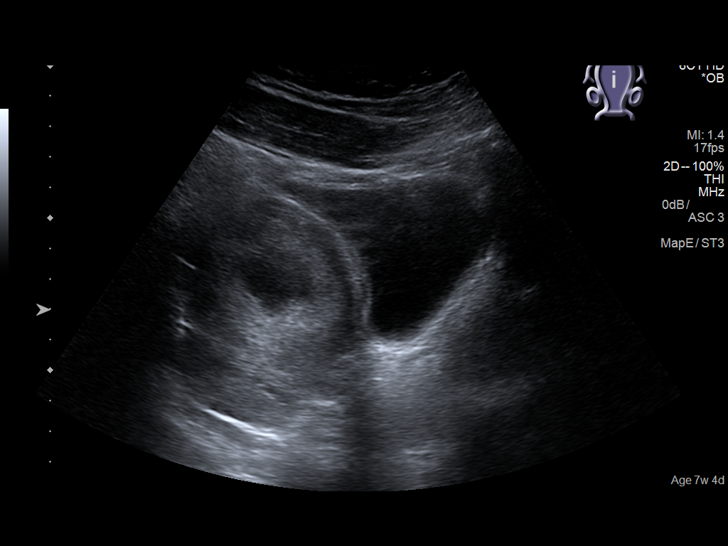
[im 13/68]
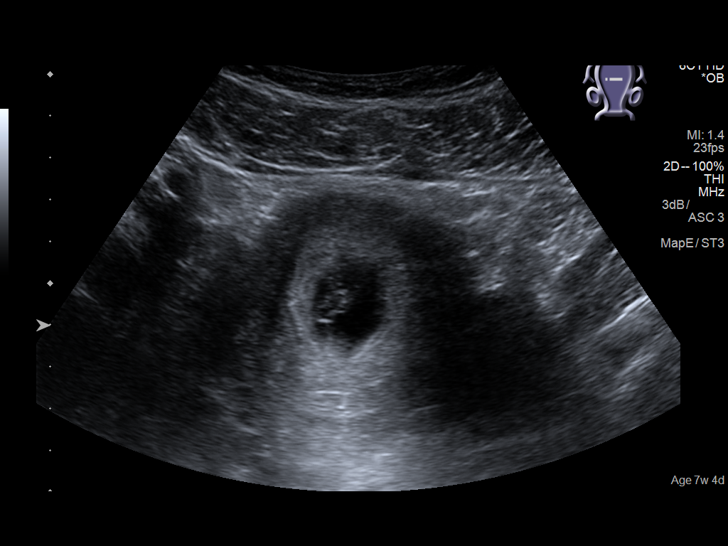
[im 18/68]
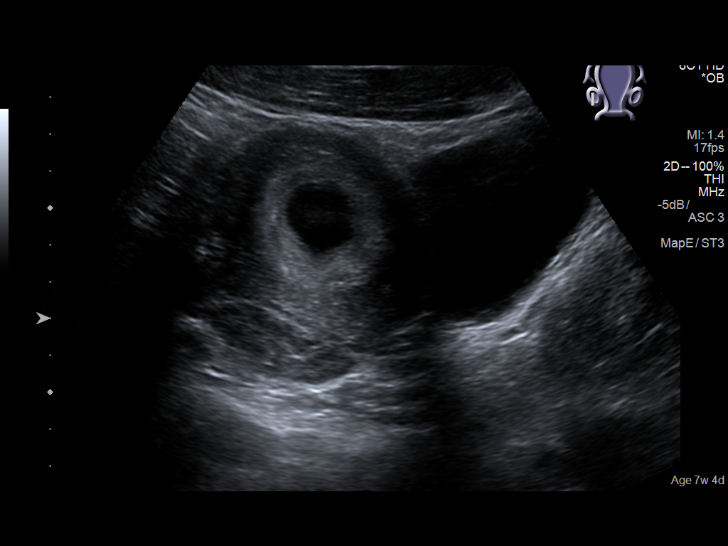
[im 23/68]
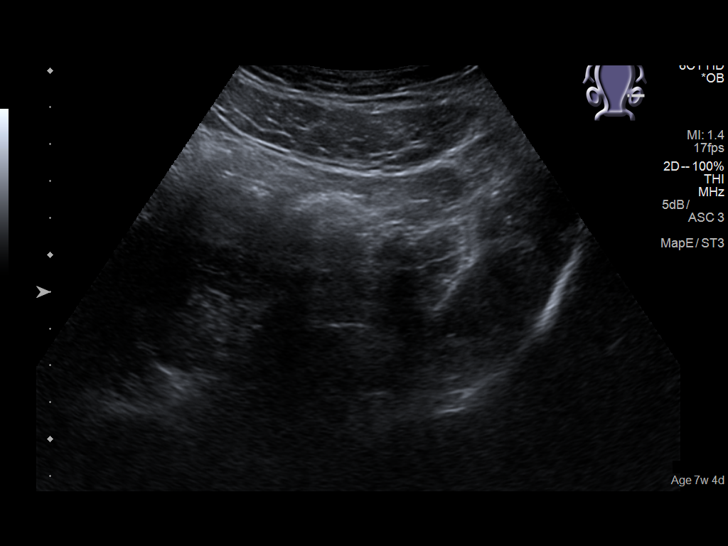
[im 28/68]
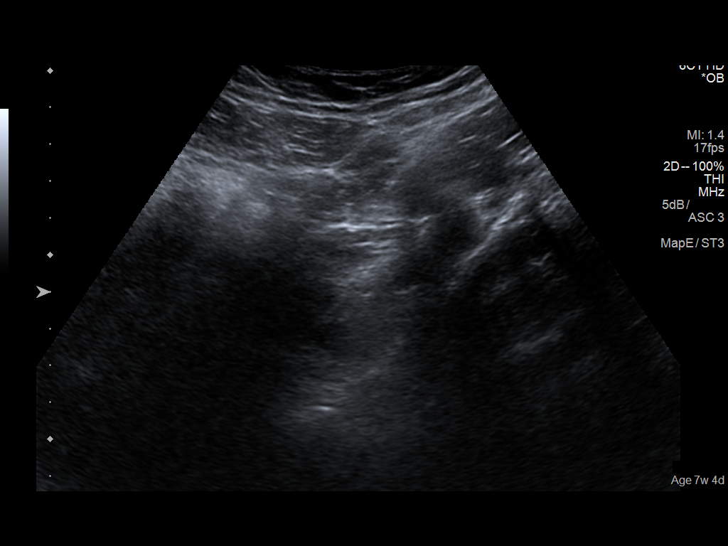
[im 33/68]
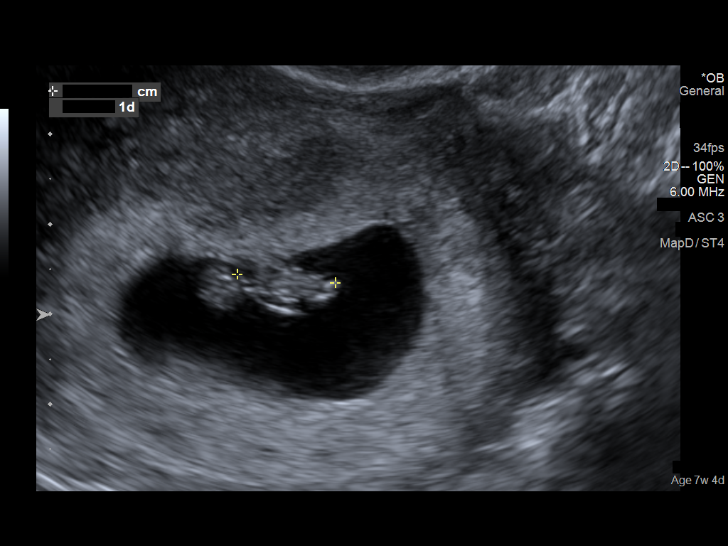
[im 38/68]
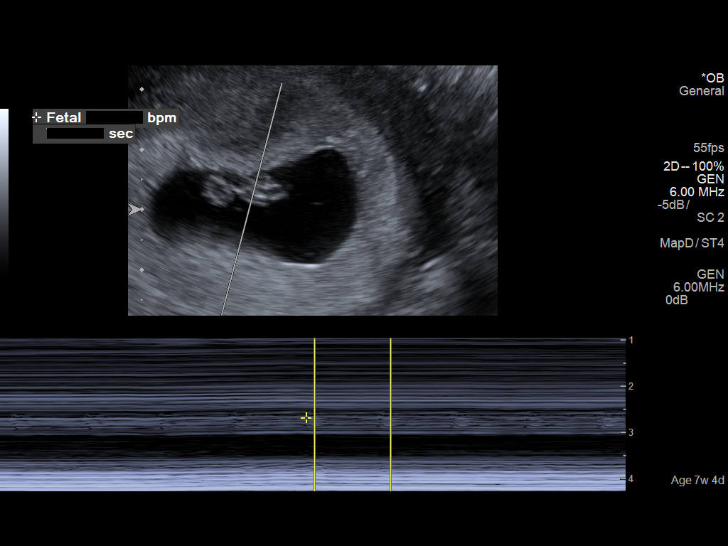
[im 43/68]
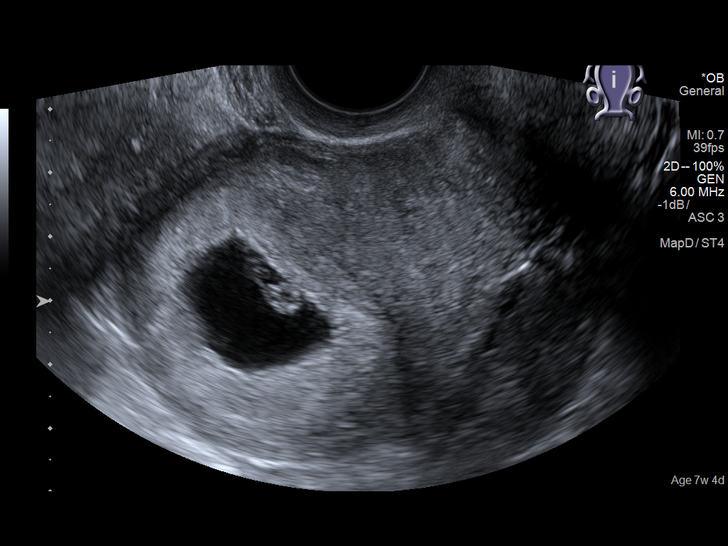
[im 48/68]
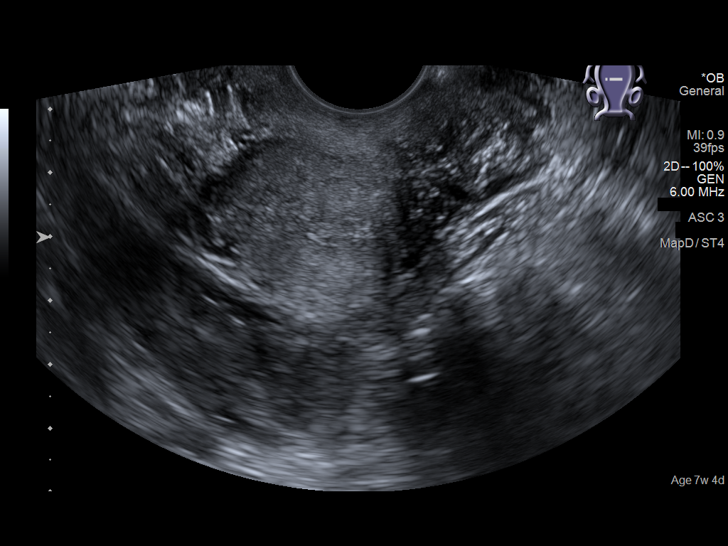
[im 53/68]
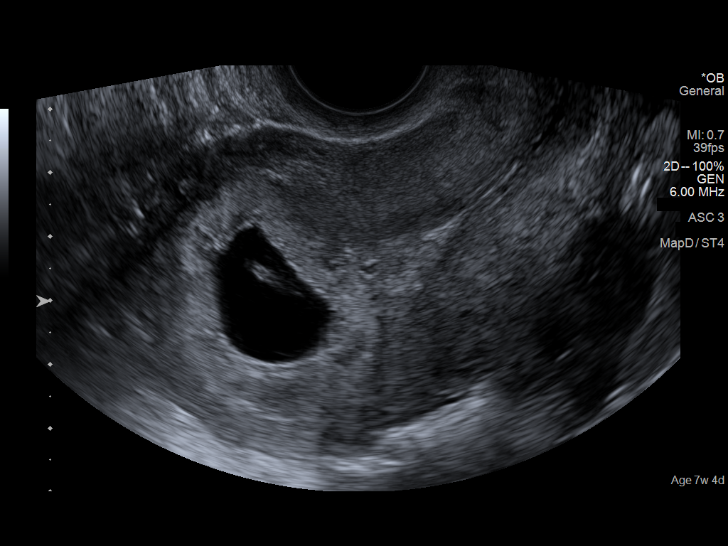
[im 58/68]
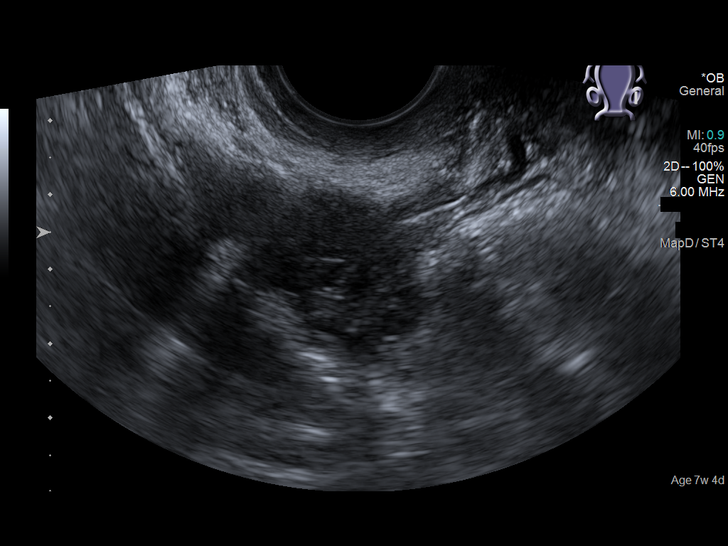
[im 63/68]
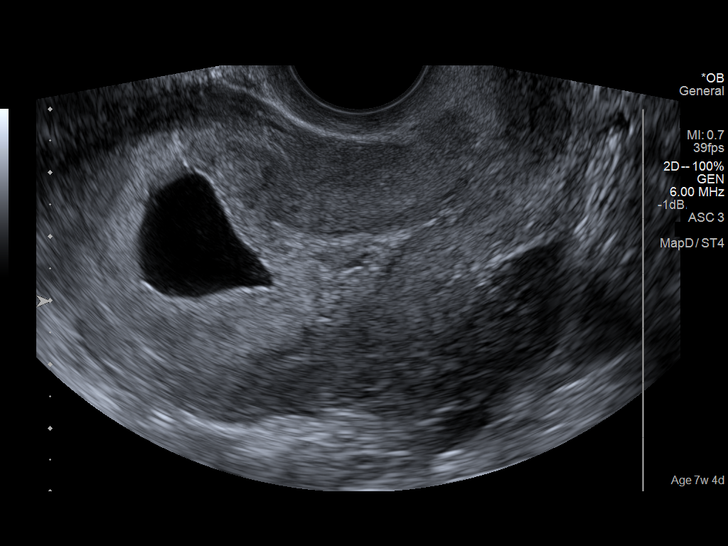
[im 68/68]
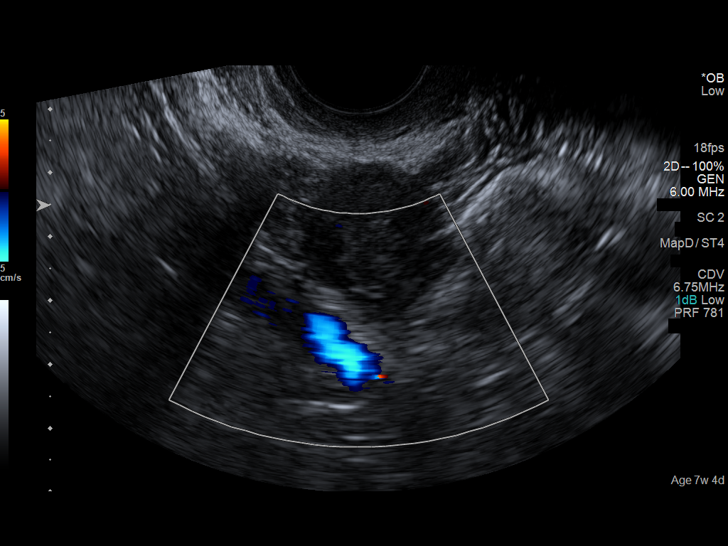

[14 of 28 positions shown; findings below may reference images not displayed]

FINDINGS: Intrauterine gestational sac: Single.

Yolk sac:  Visualized.

Embryo:  Visualized.

Cardiac Activity: Visualized.

Heart Rate: 135  bpm

CRL:  11  mm   7 w   1 d                  US EDC: 01/04/2018

Subchorionic hemorrhage:  None visualized.

Maternal uterus/adnexae: Unremarkable.
IMPRESSION: Single living intrauterine gestation at estimated 7 week 1 day
gestational age by crown-rump length.

## 2017-11-20 ENCOUNTER — Ambulatory Visit (INDEPENDENT_AMBULATORY_CARE_PROVIDER_SITE_OTHER): Payer: Medicaid Other | Admitting: Obstetrics and Gynecology

## 2017-11-20 VITALS — BP 105/69 | HR 101 | Wt 220.8 lb

## 2017-11-20 DIAGNOSIS — O24410 Gestational diabetes mellitus in pregnancy, diet controlled: Secondary | ICD-10-CM

## 2017-11-20 DIAGNOSIS — O0993 Supervision of high risk pregnancy, unspecified, third trimester: Secondary | ICD-10-CM

## 2017-11-20 NOTE — Progress Notes (Signed)
Patient reports good fetal movement, denies pain. Patient states that she is not checking BG because she does not believe 2hr gtt results were accurate because she had a milkshake that day.

## 2017-11-20 NOTE — Progress Notes (Signed)
Subjective:  Linda Reyes is a 26 y.o. G1P0 at 4132w0d being seen today for ongoing prenatal care.  She is currently monitored for the following issues for this high-risk pregnancy and has Supervision of high risk pregnancy, antepartum, third trimester and Gestational diabetes mellitus, antepartum on their problem list.  Patient reports no complaints.  Contractions: Not present. Vag. Bleeding: None.  Movement: Present. Denies leaking of fluid.   The following portions of the patient's history were reviewed and updated as appropriate: allergies, current medications, past family history, past medical history, past social history, past surgical history and problem list. Problem list updated.  Objective:   Vitals:   11/20/17 1626  BP: 105/69  Pulse: (!) 101  Weight: 220 lb 12.8 oz (100.2 kg)    Fetal Status: Fetal Heart Rate (bpm): 138   Movement: Present     General:  Alert, oriented and cooperative. Patient is in no acute distress.  Skin: Skin is warm and dry. No rash noted.   Cardiovascular: Normal heart rate noted  Respiratory: Normal respiratory effort, no problems with respiration noted  Abdomen: Soft, gravid, appropriate for gestational age. Pain/Pressure: Absent     Pelvic:  Cervical exam deferred        Extremities: Normal range of motion.  Edema: Trace  Mental Status: Normal mood and affect. Normal behavior. Normal judgment and thought content.   Urinalysis:      Assessment and Plan:  Pregnancy: G1P0 at 1432w0d  1. Supervision of high risk pregnancy, antepartum, third trimester Stable  2. Diet controlled gestational diabetes mellitus (GDM), antepartum Pt does not believe she has GDM. Appears was not fasting at time of glucola. Fasting BS reading was abnormal by 1 point.  Was offered to return for fasting BS at appt 2 weeks but has not showed. She is not checking her BS at home either Offered 1 hr pp and Hgb A1c today but pt declined  U/S for growth ordered for next  week   Preterm labor symptoms and general obstetric precautions including but not limited to vaginal bleeding, contractions, leaking of fluid and fetal movement were reviewed in detail with the patient. Please refer to After Visit Summary for other counseling recommendations.  Return in about 1 week (around 11/27/2017) for OB visit.   Hermina StaggersErvin, Aldo Sondgeroth L, MD

## 2017-11-20 NOTE — Addendum Note (Signed)
Addended by: Hermina StaggersERVIN, Ludmilla Mcgillis L on: 11/20/2017 04:58 PM   Modules accepted: Orders

## 2017-11-26 ENCOUNTER — Encounter: Payer: Medicaid Other | Admitting: Obstetrics & Gynecology

## 2017-11-27 ENCOUNTER — Ambulatory Visit (HOSPITAL_COMMUNITY)
Admission: RE | Admit: 2017-11-27 | Discharge: 2017-11-27 | Disposition: A | Discharge status: Home / Self Care | Payer: Medicaid Other | Source: Ambulatory Visit | Attending: Obstetrics and Gynecology | Admitting: Obstetrics and Gynecology

## 2017-11-27 DIAGNOSIS — Z3A35 35 weeks gestation of pregnancy: Secondary | ICD-10-CM | POA: Diagnosis not present

## 2017-11-27 DIAGNOSIS — O24410 Gestational diabetes mellitus in pregnancy, diet controlled: Secondary | ICD-10-CM | POA: Diagnosis present

## 2017-11-28 ENCOUNTER — Ambulatory Visit (INDEPENDENT_AMBULATORY_CARE_PROVIDER_SITE_OTHER): Payer: Medicaid Other | Admitting: Obstetrics & Gynecology

## 2017-11-28 DIAGNOSIS — O24419 Gestational diabetes mellitus in pregnancy, unspecified control: Secondary | ICD-10-CM

## 2017-11-28 NOTE — Progress Notes (Signed)
   PRENATAL VISIT NOTE  Subjective:  Linda Reyes is a 26 y.o. G1P0 at 6084w1d being seen today for ongoing prenatal care.  She is currently monitored for the following issues for this low-risk pregnancy and has Supervision of high risk pregnancy, antepartum, third trimester and Gestational diabetes mellitus, antepartum on their problem list.  Patient reports no complaints.  Contractions: Not present. Vag. Bleeding: None.  Movement: Present. Denies leaking of fluid.   The following portions of the patient's history were reviewed and updated as appropriate: allergies, current medications, past family history, past medical history, past social history, past surgical history and problem list. Problem list updated.  Objective:   Vitals:   11/28/17 0923  BP: 106/67  Pulse: 86  Weight: 219 lb (99.3 kg)    Fetal Status:     Movement: Present     General:  Alert, oriented and cooperative. Patient is in no acute distress.  Skin: Skin is warm and dry. No rash noted.   Cardiovascular: Normal heart rate noted  Respiratory: Normal respiratory effort, no problems with respiration noted  Abdomen: Soft, gravid, appropriate for gestational age.  Pain/Pressure: Absent     Pelvic: Cervical exam deferred        Extremities: Normal range of motion.  Edema: Trace  Mental Status:  Normal mood and affect. Normal behavior. Normal judgment and thought content.   Assessment and Plan:  Pregnancy: G1P0 at 984w1d  1. Gestational diabetes mellitus (GDM), antepartum, gestational diabetes method of control unspecified 2 hr was not valid due to test done not fasting  Preterm labor symptoms and general obstetric precautions including but not limited to vaginal bleeding, contractions, leaking of fluid and fetal movement were reviewed in detail with the patient. Please refer to After Visit Summary for other counseling recommendations.  Return in about 1 week (around 12/05/2017). Nl growth on US yesterday  Scheryl DarterJames  Latoyna Hird, MD

## 2017-11-28 NOTE — Patient Instructions (Signed)

## 2017-12-04 ENCOUNTER — Encounter: Payer: Self-pay | Admitting: Certified Nurse Midwife

## 2017-12-04 ENCOUNTER — Other Ambulatory Visit (HOSPITAL_COMMUNITY)
Admission: RE | Admit: 2017-12-04 | Discharge: 2017-12-04 | Disposition: A | Discharge status: Home / Self Care | Payer: Medicaid Other | Source: Ambulatory Visit | Attending: Certified Nurse Midwife | Admitting: Certified Nurse Midwife

## 2017-12-04 ENCOUNTER — Ambulatory Visit (INDEPENDENT_AMBULATORY_CARE_PROVIDER_SITE_OTHER): Payer: Medicaid Other | Admitting: Certified Nurse Midwife

## 2017-12-04 VITALS — BP 136/71 | HR 90 | Wt 221.2 lb

## 2017-12-04 DIAGNOSIS — B3731 Acute candidiasis of vulva and vagina: Secondary | ICD-10-CM

## 2017-12-04 DIAGNOSIS — O0993 Supervision of high risk pregnancy, unspecified, third trimester: Secondary | ICD-10-CM | POA: Diagnosis present

## 2017-12-04 DIAGNOSIS — Z3A36 36 weeks gestation of pregnancy: Secondary | ICD-10-CM | POA: Diagnosis not present

## 2017-12-04 DIAGNOSIS — O24410 Gestational diabetes mellitus in pregnancy, diet controlled: Secondary | ICD-10-CM

## 2017-12-04 DIAGNOSIS — B373 Candidiasis of vulva and vagina: Secondary | ICD-10-CM

## 2017-12-04 MED ORDER — TERCONAZOLE 0.8 % VA CREA: 1.0000 | TOPICAL_CREAM | Freq: Every day | VAGINAL | 0 refills | Status: DC

## 2017-12-04 MED ORDER — FLUCONAZOLE 150 MG PO TABS: 150.0000 mg | ORAL_TABLET | Freq: Once | ORAL | 0 refills | Status: AC

## 2017-12-04 NOTE — Progress Notes (Signed)
Patient reports good fetal movement, denies pain. Pt reports that she is still not checking BG.

## 2017-12-04 NOTE — Patient Instructions (Signed)
AREA PEDIATRIC/FAMILY PRACTICE PHYSICIANS  [facility] [address], [address] [place], [place]  [address] Phone - [phone]   Fax - [phone]  [facility] [address] [place], [place] [address] Phone - [phone]   Fax - [phone]  [facility] [address] [place], [place]  [address] Phone - [phone]   Fax - [phone]  [facility] [address] [place], [place]  [address] Phone - [phone]   Fax - [phone]  [facility] [address] [place], [place]  [address] Phone - [phone]   Fax - [phone]  [facility] [address] [place], [place]  [address] Phone - [phone]   Fax - [phone]  [facility] [address] [place], [place]  [address] Phone - [phone]   Fax - [phone]  [facility] [address] [place], [place]  [address] Phone - [phone]   Fax - [phone]  [facility] [address] [place], [place]  [address] Phone - [phone]   Fax - [phone] [facility] [address] [place], [place]  [address] Phone - [phone]   Fax - [phone]  [facility] [address] [place], [place]  [address] Phone - [phone]   Fax - [phone]  [facility] [address] [place], [place]  [address] Phone - [phone]   Fax - [phone]  [facility] [address] [place], [place]  [address] Phone - [phone]   Fax - [phone]  [facility] [address] [place], [place]  [address] Phone - [phone]  [facility] [address] [place], [place]  [address] Phone - [phone]   Fax - [phone]  [facility] [address] [place], [place]  [address] Phone - [phone]   Fax - [phone]  [facility] [address] [place], [place]  [address] Phone - [phone]   Fax - [phone]  [facility] [address] [place], [place]  [address] Phone - [phone]   Fax - [phone]   [facility] [address] [place], [place]  [address] Phone - [phone]   Fax - [phone]  [facility] [address] [place], [place]  [address] Phone - [phone]   Fax - [phone]  [facility] [address] [place], [place]  [address] Phone - [phone]   Fax - [phone]  [facility] [address] [place], [place]  [address] Phone - [phone]   Fax - [phone]  [facility] [address] , [place]  [address] Phone - [phone]   Fax - [phone] [facility] [address] [place], [place]  [address] Phone - [phone]   Fax - [phone]  [facility] [address] [place], [place]  [address] Phone - [phone]   Fax - [phone]  [facility] [address] [place], [place]  [address] Phone - [phone]   Fax - [phone]  [facility] Charlene Flemming MD [address] [place]  [address] Phone [phone]  Fax [phone]   

## 2017-12-04 NOTE — Progress Notes (Signed)
   PRENATAL VISIT NOTE  Subjective:  Linda Reyes is a 26 y.o. G1P0 at 9147w0d being seen today for ongoing prenatal care.  She is currently monitored for the following issues for this high-risk pregnancy and has Supervision of high risk pregnancy, antepartum, third trimester and Gestational diabetes mellitus, antepartum on their problem list.  Patient reports no complaints.  Contractions: Not present. Vag. Bleeding: None.  Movement: Present. Denies leaking of fluid.   The following portions of the patient's history were reviewed and updated as appropriate: allergies, current medications, past family history, past medical history, past social history, past surgical history and problem list. Problem list updated.  Objective:   Vitals:   12/04/17 1454  BP: 136/71  Pulse: 90  Weight: 221 lb 3.2 oz (100.3 kg)    Fetal Status: Fetal Heart Rate (bpm): 132-145; doppler Fundal Height: 36 cm Movement: Present  Presentation: Vertex  General:  Alert, oriented and cooperative. Patient is in no acute distress.  Skin: Skin is warm and dry. No rash noted.   Cardiovascular: Normal heart rate noted  Respiratory: Normal respiratory effort, no problems with respiration noted  Abdomen: Soft, gravid, appropriate for gestational age.  Pain/Pressure: Absent     Pelvic: Cervical exam performed Dilation: Closed Effacement (%): Thick Station: -3  Extremities: Normal range of motion.  Edema: Trace  Mental Status:  Normal mood and affect. Normal behavior. Normal judgment and thought content.   Assessment and Plan:  Pregnancy: G1P0 at 2247w0d  1. Supervision of high risk pregnancy, antepartum, third trimester     Medical non-compliance: is not checking her blood sugars.  - Cervicovaginal ancillary only - Strep Gp B NAA  2. Diet controlled gestational diabetes mellitus (GDM), antepartum     Needs random CBG/HgBA1C.       3. Yeast vaginitis     OTC probiotics recommended.  - fluconazole (DIFLUCAN) 150 MG  tablet; Take 1 tablet (150 mg total) by mouth once for 1 dose.  Dispense: 1 tablet; Refill: 0 - terconazole (TERAZOL 3) 0.8 % vaginal cream; Place 1 applicator vaginally at bedtime.  Dispense: 20 g; Refill: 0   Preterm labor symptoms and general obstetric precautions including but not limited to vaginal bleeding, contractions, leaking of fluid and fetal movement were reviewed in detail with the patient. Please refer to After Visit Summary for other counseling recommendations.  Return in about 1 week (around 12/11/2017) for ROB.   Roe Coombsachelle A Mardee Clune, CNM

## 2017-12-05 LAB — CERVICOVAGINAL ANCILLARY ONLY
Bacterial vaginitis: NEGATIVE
Candida vaginitis: POSITIVE — AB
Chlamydia: NEGATIVE
Neisseria Gonorrhea: NEGATIVE
Trichomonas: NEGATIVE

## 2017-12-06 ENCOUNTER — Other Ambulatory Visit: Payer: Self-pay | Admitting: Certified Nurse Midwife

## 2017-12-06 DIAGNOSIS — O99820 Streptococcus B carrier state complicating pregnancy: Secondary | ICD-10-CM

## 2017-12-06 DIAGNOSIS — O0993 Supervision of high risk pregnancy, unspecified, third trimester: Secondary | ICD-10-CM

## 2017-12-06 DIAGNOSIS — B3731 Acute candidiasis of vulva and vagina: Secondary | ICD-10-CM

## 2017-12-06 DIAGNOSIS — B373 Candidiasis of vulva and vagina: Secondary | ICD-10-CM

## 2017-12-06 LAB — STREP GP B NAA: Strep Gp B NAA: POSITIVE — AB

## 2017-12-06 MED ORDER — FLUCONAZOLE 150 MG PO TABS: 150.0000 mg | ORAL_TABLET | Freq: Once | ORAL | 0 refills | Status: AC

## 2017-12-11 ENCOUNTER — Ambulatory Visit (INDEPENDENT_AMBULATORY_CARE_PROVIDER_SITE_OTHER): Payer: Medicaid Other | Admitting: Obstetrics

## 2017-12-11 VITALS — BP 102/71 | HR 98 | Wt 222.0 lb

## 2017-12-11 DIAGNOSIS — O0993 Supervision of high risk pregnancy, unspecified, third trimester: Secondary | ICD-10-CM

## 2017-12-11 DIAGNOSIS — O24410 Gestational diabetes mellitus in pregnancy, diet controlled: Secondary | ICD-10-CM

## 2017-12-12 ENCOUNTER — Encounter: Payer: Self-pay | Admitting: Obstetrics

## 2017-12-12 NOTE — Progress Notes (Signed)
Subjective:  Linda Reyes is a 26 y.o. G1P0 at 6684w1d being seen today for ongoing prenatal care.  She is currently monitored for the following issues for this high-risk pregnancy and has Supervision of high risk pregnancy, antepartum, third trimester; Gestational diabetes mellitus, antepartum; and GBS (group B Streptococcus carrier), +RV culture, currently pregnant on their problem list.  Patient reports no complaints.  Contractions: Not present. Vag. Bleeding: None.  Movement: Present. Denies leaking of fluid.   The following portions of the patient's history were reviewed and updated as appropriate: allergies, current medications, past family history, past medical history, past social history, past surgical history and problem list. Problem list updated.  Objective:   Vitals:   12/11/17 1638  BP: 102/71  Pulse: 98  Weight: 222 lb (100.7 kg)    Fetal Status: Fetal Heart Rate (bpm): 140   Movement: Present     General:  Alert, oriented and cooperative. Patient is in no acute distress.  Skin: Skin is warm and dry. No rash noted.   Cardiovascular: Normal heart rate noted  Respiratory: Normal respiratory effort, no problems with respiration noted  Abdomen: Soft, gravid, appropriate for gestational age. Pain/Pressure: Absent     Pelvic:  Cervical exam deferred        Extremities: Normal range of motion.     Mental Status: Normal mood and affect. Normal behavior. Normal judgment and thought content.   Urinalysis:      Assessment and Plan:  Pregnancy: G1P0 at 284w1d  1. Supervision of high risk pregnancy, antepartum, third trimester   2. Diet controlled gestational diabetes mellitus (GDM), antepartum - stable glucose checks  Term labor symptoms and general obstetric precautions including but not limited to vaginal bleeding, contractions, leaking of fluid and fetal movement were reviewed in detail with the patient. Please refer to After Visit Summary for other counseling  recommendations.  Return in about 1 week (around 12/18/2017) for ROB.   Brock BadHarper, Heiley Shaikh A, MD

## 2017-12-17 NOTE — L&D Delivery Note (Signed)
Patient is a 27 y.o. now G1P1 s/p NSVD at 6673w4d, who was admitted for SOL. Pregnancy complicated by GDM-diet controlled.   She progressed with augmentation (AROM, Pitocin) to complete and pushed 52 minutes. Shoulder dystocia occurred for 2 minutes; McRoberts, Suprapubic pressure and posterior arm delivered prior to delivery of baby by Dr Jolayne Pantheronstant. NICU at delivery. Cord clamped and cut by CNM for assessment by NICU.  Placenta intact and spontaneous, bleeding minimal.  2nd degree laceration repaired without difficulty.  Mom and baby stable prior to transfer to postpartum. She plans on breastfeeding. She is unsure about birth control.  Delivery Note At 12:08 AM a viable female was delivered via Vaginal, Spontaneous with shoulder dystocia (Presentation: LOA).  APGAR: 8, 9; weight 8 lb 5 oz (3770 g).   Placenta to pathology- Delivered spontaneous via Shultz, 3V Cord Cord pH: 7.35  Anesthesia: Epidural and Local lidocaine  Episiotomy: None Lacerations: 2nd degree;Perineal Suture Repair: 2.0 vicryl Est. Blood Loss (mL):  100  Mom to postpartum.  Baby to Couplet care / Skin to Skin.  Sharyon CableVeronica C Blair Reyes 12/29/2017, 1:10 AM      Sharyon CableVeronica C Mardy Reyes, CNM 12/29/17, 1:10 AM

## 2017-12-18 ENCOUNTER — Ambulatory Visit (INDEPENDENT_AMBULATORY_CARE_PROVIDER_SITE_OTHER): Payer: Medicaid Other | Admitting: Obstetrics and Gynecology

## 2017-12-18 ENCOUNTER — Encounter: Payer: Self-pay | Admitting: Obstetrics and Gynecology

## 2017-12-18 VITALS — BP 107/68 | HR 89 | Wt 224.5 lb

## 2017-12-18 DIAGNOSIS — O99820 Streptococcus B carrier state complicating pregnancy: Secondary | ICD-10-CM

## 2017-12-18 DIAGNOSIS — O0993 Supervision of high risk pregnancy, unspecified, third trimester: Secondary | ICD-10-CM

## 2017-12-18 DIAGNOSIS — O24410 Gestational diabetes mellitus in pregnancy, diet controlled: Secondary | ICD-10-CM

## 2017-12-18 NOTE — Progress Notes (Signed)
   PRENATAL VISIT NOTE  Subjective:  Linda Reyes is a 27 y.o. G1P0 at 172w0d being seen today for ongoing prenatal care.  She is currently monitored for the following issues for this high-risk pregnancy and has Supervision of high risk pregnancy, antepartum, third trimester; Gestational diabetes mellitus, antepartum; and GBS (group B Streptococcus carrier), +RV culture, currently pregnant on their problem list.  Patient reports abdominal pruritis worst in the evening.  Contractions: Not present. Vag. Bleeding: None.  Movement: Present. Denies leaking of fluid.   The following portions of the patient's history were reviewed and updated as appropriate: allergies, current medications, past family history, past medical history, past social history, past surgical history and problem list. Problem list updated.  Objective:   Vitals:   12/18/17 1451  BP: 107/68  Pulse: 89  Weight: 224 lb 8 oz (101.8 kg)    Fetal Status: Fetal Heart Rate (bpm): 140 Fundal Height: 39 cm Movement: Present     General:  Alert, oriented and cooperative. Patient is in no acute distress.  Skin: Skin is warm and dry. No rash noted.   Cardiovascular: Normal heart rate noted  Respiratory: Normal respiratory effort, no problems with respiration noted  Abdomen: Soft, gravid, appropriate for gestational age.  Pain/Pressure: Present     Pelvic: Cervical exam deferred        Extremities: Normal range of motion.  Edema: Trace  Mental Status:  Normal mood and affect. Normal behavior. Normal judgment and thought content.   Assessment and Plan:  Pregnancy: G1P0 at 222w0d  1. Supervision of high risk pregnancy, antepartum, third trimester Patient is doing well Bile acids ordered - Bile acids, total - Hemoglobin A1c  2. Diet controlled gestational diabetes mellitus (GDM), antepartum Patient has not been checking CBG since diagnosis as she feels the testing was abnormal Will order hg A1c today Normal growth ultrasound  on 11/27/2017  3. GBS (group B Streptococcus carrier), +RV culture, currently pregnant Will provide prophylaxis in labor  Term labor symptoms and general obstetric precautions including but not limited to vaginal bleeding, contractions, leaking of fluid and fetal movement were reviewed in detail with the patient. Please refer to After Visit Summary for other counseling recommendations.  No Follow-up on file.   Catalina AntiguaPeggy Chyane Greer, MD

## 2017-12-18 NOTE — Progress Notes (Signed)
Pt reports she does not check blood sugars

## 2017-12-19 LAB — HEMOGLOBIN A1C
Est. average glucose Bld gHb Est-mCnc: 131 mg/dL
Hgb A1c MFr Bld: 6.2 % — ABNORMAL HIGH (ref 4.8–5.6)

## 2017-12-19 LAB — BILE ACIDS, TOTAL: Bile Acids Total: 8.5 umol/L (ref 4.7–24.5)

## 2017-12-23 ENCOUNTER — Telehealth (HOSPITAL_COMMUNITY): Payer: Self-pay | Admitting: *Deleted

## 2017-12-23 ENCOUNTER — Encounter (HOSPITAL_COMMUNITY): Payer: Self-pay | Admitting: *Deleted

## 2017-12-23 NOTE — Telephone Encounter (Signed)
Preadmission screen  

## 2017-12-25 ENCOUNTER — Ambulatory Visit (INDEPENDENT_AMBULATORY_CARE_PROVIDER_SITE_OTHER): Payer: Medicaid Other | Admitting: Certified Nurse Midwife

## 2017-12-25 VITALS — BP 124/76 | HR 99 | Wt 225.4 lb

## 2017-12-25 DIAGNOSIS — O0993 Supervision of high risk pregnancy, unspecified, third trimester: Secondary | ICD-10-CM

## 2017-12-25 DIAGNOSIS — Z9119 Patient's noncompliance with other medical treatment and regimen: Secondary | ICD-10-CM

## 2017-12-25 DIAGNOSIS — O24419 Gestational diabetes mellitus in pregnancy, unspecified control: Secondary | ICD-10-CM | POA: Diagnosis not present

## 2017-12-25 DIAGNOSIS — O99820 Streptococcus B carrier state complicating pregnancy: Secondary | ICD-10-CM

## 2017-12-25 DIAGNOSIS — Z91199 Patient's noncompliance with other medical treatment and regimen due to unspecified reason: Secondary | ICD-10-CM

## 2017-12-25 LAB — GLUCOSE, POCT (MANUAL RESULT ENTRY): POC Glucose: 122 mg/dl — AB (ref 70–99)

## 2017-12-25 NOTE — Progress Notes (Signed)
Pt would like to know if her IOL date will change since A1C at last OV was elevated.

## 2017-12-27 ENCOUNTER — Inpatient Hospital Stay (HOSPITAL_COMMUNITY)
Admission: AD | Admit: 2017-12-27 | Discharge: 2017-12-27 | Disposition: A | Discharge status: Home / Self Care | Payer: Medicaid Other | Source: Ambulatory Visit | Attending: Obstetrics & Gynecology | Admitting: Obstetrics & Gynecology

## 2017-12-27 ENCOUNTER — Other Ambulatory Visit: Payer: Self-pay

## 2017-12-27 ENCOUNTER — Encounter (HOSPITAL_COMMUNITY): Payer: Self-pay | Admitting: *Deleted

## 2017-12-27 ENCOUNTER — Inpatient Hospital Stay (HOSPITAL_COMMUNITY)
Admission: AD | Admit: 2017-12-27 | Discharge: 2017-12-27 | Disposition: A | Discharge status: Home / Self Care | Payer: Medicaid Other | Source: Ambulatory Visit | Attending: Obstetrics and Gynecology | Admitting: Obstetrics and Gynecology

## 2017-12-27 DIAGNOSIS — O479 False labor, unspecified: Secondary | ICD-10-CM

## 2017-12-27 NOTE — MAU Note (Signed)
Pt returns with worsening contractions.  

## 2017-12-27 NOTE — Discharge Instructions (Signed)
Braxton Hicks Contractions °Contractions of the uterus can occur throughout pregnancy, but they are not always a sign that you are in labor. You may have practice contractions called Braxton Hicks contractions. These false labor contractions are sometimes confused with true labor. °What are Braxton Hicks contractions? °Braxton Hicks contractions are tightening movements that occur in the muscles of the uterus before labor. Unlike true labor contractions, these contractions do not result in opening (dilation) and thinning of the cervix. Toward the end of pregnancy (32-34 weeks), Braxton Hicks contractions can happen more often and may become stronger. These contractions are sometimes difficult to tell apart from true labor because they can be very uncomfortable. You should not feel embarrassed if you go to the hospital with false labor. °Sometimes, the only way to tell if you are in true labor is for your health care provider to look for changes in the cervix. The health care provider will do a physical exam and may monitor your contractions. If you are not in true labor, the exam should show that your cervix is not dilating and your water has not broken. °If there are other health problems associated with your pregnancy, it is completely safe for you to be sent home with false labor. You may continue to have Braxton Hicks contractions until you go into true labor. °How to tell the difference between true labor and false labor °True labor °· Contractions last 30-70 seconds. °· Contractions become very regular. °· Discomfort is usually felt in the top of the uterus, and it spreads to the lower abdomen and low back. °· Contractions do not go away with walking. °· Contractions usually become more intense and increase in frequency. °· The cervix dilates and gets thinner. °False labor °· Contractions are usually shorter and not as strong as true labor contractions. °· Contractions are usually irregular. °· Contractions  are often felt in the front of the lower abdomen and in the groin. °· Contractions may go away when you walk around or change positions while lying down. °· Contractions get weaker and are shorter-lasting as time goes on. °· The cervix usually does not dilate or become thin. °Follow these instructions at home: °· Take over-the-counter and prescription medicines only as told by your health care provider. °· Keep up with your usual exercises and follow other instructions from your health care provider. °· Eat and drink lightly if you think you are going into labor. °· If Braxton Hicks contractions are making you uncomfortable: °? Change your position from lying down or resting to walking, or change from walking to resting. °? Sit and rest in a tub of warm water. °? Drink enough fluid to keep your urine pale yellow. Dehydration may cause these contractions. °? Do slow and deep breathing several times an hour. °· Keep all follow-up prenatal visits as told by your health care provider. This is important. °Contact a health care provider if: °· You have a fever. °· You have continuous pain in your abdomen. °Get help right away if: °· Your contractions become stronger, more regular, and closer together. °· You have fluid leaking or gushing from your vagina. °· You pass blood-tinged mucus (bloody show). °· You have bleeding from your vagina. °· You have low back pain that you never had before. °· You feel your baby’s head pushing down and causing pelvic pressure. °· Your baby is not moving inside you as much as it used to. °Summary °· Contractions that occur before labor are called Braxton   Hicks contractions, false labor, or practice contractions. °· Braxton Hicks contractions are usually shorter, weaker, farther apart, and less regular than true labor contractions. True labor contractions usually become progressively stronger and regular and they become more frequent. °· Manage discomfort from Braxton Hicks contractions by  changing position, resting in a warm bath, drinking plenty of water, or practicing deep breathing. °This information is not intended to replace advice given to you by your health care provider. Make sure you discuss any questions you have with your health care provider. °Document Released: 04/18/2017 Document Revised: 04/18/2017 Document Reviewed: 04/18/2017 °Elsevier Interactive Patient Education © 2018 [employer] ° °Fetal Movement Counts °Patient Name: ________________________________________________ Patient Due Date: ____________________ °What is a fetal movement count? °A fetal movement count is the number of times that you feel your baby move during a certain amount of time. This may also be called a fetal kick count. A fetal movement count is recommended for every pregnant woman. You may be asked to start counting fetal movements as early as week 28 of your pregnancy. °Pay attention to when your baby is most active. You may notice your baby's sleep and wake cycles. You may also notice things that make your baby move more. You should do a fetal movement count: °· When your baby is normally most active. °· At the same time each day. ° °A good time to count movements is while you are resting, after having something to eat and drink. °How do I count fetal movements? °1. Find a quiet, comfortable area. Sit, or lie down on your side. °2. Write down the date, the start time and stop time, and the number of movements that you felt between those two times. Take this information with you to your health care visits. °3. For 2 hours, count kicks, flutters, swishes, rolls, and jabs. You should feel at least 10 movements during 2 hours. °4. You may stop counting after you have felt 10 movements. °5. If you do not feel 10 movements in 2 hours, have something to eat and drink. Then, keep resting and counting for 1 hour. If you feel at least 4 movements during that hour, you may stop counting. °Contact a health care  provider if: °· You feel fewer than 4 movements in 2 hours. °· Your baby is not moving like he or she usually does. °Date: ____________ Start time: ____________ Stop time: ____________ Movements: ____________ °Date: ____________ Start time: ____________ Stop time: ____________ Movements: ____________ °Date: ____________ Start time: ____________ Stop time: ____________ Movements: ____________ °Date: ____________ Start time: ____________ Stop time: ____________ Movements: ____________ °Date: ____________ Start time: ____________ Stop time: ____________ Movements: ____________ °Date: ____________ Start time: ____________ Stop time: ____________ Movements: ____________ °Date: ____________ Start time: ____________ Stop time: ____________ Movements: ____________ °Date: ____________ Start time: ____________ Stop time: ____________ Movements: ____________ °Date: ____________ Start time: ____________ Stop time: ____________ Movements: ____________ °This information is not intended to replace advice given to you by your health care provider. Make sure you discuss any questions you have with your health care provider. °Document Released: 01/02/2007 Document Revised: 08/01/2016 Document Reviewed: 01/12/2016 °Elsevier Interactive Patient Education © 2018 [employer] ° °

## 2017-12-27 NOTE — MAU Note (Signed)
Contractions, pressure and hurting in pelvis.

## 2017-12-27 NOTE — MAU Note (Signed)
I have communicated with Dr. Neomia GlassParker Leland and reviewed vital signs:  Vitals:   12/27/17 1747 12/27/17 1941  BP: 114/61 120/65  Pulse: 88 75  Resp: 19 17  Temp: 98.3 F (36.8 C) 97.9 F (36.6 C)  SpO2: 97%     Vaginal exam:  Dilation: 4 Effacement (%): 80 Cervical Position: Middle Station: -2 Presentation: Vertex Exam by:: Dorrene GermanJ. Lowe RN,   Also reviewed contraction pattern and that non-stress test is reactive.  It has been documented that patient is contracting every 7 minutes with no cervical change over 2 hours not indicating active labor.  Patient denies any other complaints.  Based on this report provider has given order for discharge.  A discharge order and diagnosis entered by a provider.   Labor discharge instructions reviewed with patient.

## 2017-12-27 NOTE — MAU Note (Signed)
Urine in lab 

## 2017-12-27 NOTE — MAU Note (Signed)
..   I have communicated with Dr. Rushie GoltzLeland (resident) and reviewed vital signs:  Vitals:   12/27/17 1001  BP: 119/64  Pulse: 78  Resp: 17  Temp: (!) 97.5 F (36.4 C)  SpO2: 98%    Vaginal exam:  Dilation: 3 Effacement (%): 70 Cervical Position: Posterior Station: -3 Exam by:: Marwan Lipe, RN ,   Also reviewed contraction pattern and that non-stress test is reactive.  It has been documented that patient is contracting every 5-6 minutes with no cervical change over the last hour not indicating active labor.  Patient denies any other complaints.  Based on this report provider has given order for discharge.  A discharge order and diagnosis entered by a provider.   Labor discharge instructions reviewed with patient.

## 2017-12-28 ENCOUNTER — Inpatient Hospital Stay (HOSPITAL_COMMUNITY): Payer: Medicaid Other | Admitting: Anesthesiology

## 2017-12-28 ENCOUNTER — Encounter (HOSPITAL_COMMUNITY): Payer: Self-pay | Admitting: Anesthesiology

## 2017-12-28 ENCOUNTER — Encounter (HOSPITAL_COMMUNITY): Payer: Self-pay | Admitting: *Deleted

## 2017-12-28 ENCOUNTER — Other Ambulatory Visit: Payer: Self-pay

## 2017-12-28 ENCOUNTER — Inpatient Hospital Stay (HOSPITAL_COMMUNITY)
Admission: AD | Admit: 2017-12-28 | Discharge: 2017-12-31 | DRG: 807 | Disposition: A | Discharge status: Home / Self Care | Payer: Medicaid Other | Source: Ambulatory Visit | Attending: Obstetrics and Gynecology | Admitting: Obstetrics and Gynecology

## 2017-12-28 DIAGNOSIS — Z87891 Personal history of nicotine dependence: Secondary | ICD-10-CM

## 2017-12-28 DIAGNOSIS — Z3483 Encounter for supervision of other normal pregnancy, third trimester: Secondary | ICD-10-CM | POA: Diagnosis present

## 2017-12-28 DIAGNOSIS — O24420 Gestational diabetes mellitus in childbirth, diet controlled: Secondary | ICD-10-CM | POA: Diagnosis present

## 2017-12-28 DIAGNOSIS — O9089 Other complications of the puerperium, not elsewhere classified: Secondary | ICD-10-CM | POA: Diagnosis not present

## 2017-12-28 DIAGNOSIS — Z3A39 39 weeks gestation of pregnancy: Secondary | ICD-10-CM

## 2017-12-28 DIAGNOSIS — M25562 Pain in left knee: Secondary | ICD-10-CM | POA: Diagnosis not present

## 2017-12-28 DIAGNOSIS — O99824 Streptococcus B carrier state complicating childbirth: Principal | ICD-10-CM | POA: Diagnosis present

## 2017-12-28 DIAGNOSIS — K649 Unspecified hemorrhoids: Secondary | ICD-10-CM

## 2017-12-28 LAB — TYPE AND SCREEN
ABO/RH(D): O POS
Antibody Screen: NEGATIVE

## 2017-12-28 LAB — CBC
HCT: 35.6 % — ABNORMAL LOW (ref 36.0–46.0)
Hemoglobin: 11.9 g/dL — ABNORMAL LOW (ref 12.0–15.0)
MCH: 30.6 pg (ref 26.0–34.0)
MCHC: 33.4 g/dL (ref 30.0–36.0)
MCV: 91.5 fL (ref 78.0–100.0)
Platelets: 267 10*3/uL (ref 150–400)
RBC: 3.89 MIL/uL (ref 3.87–5.11)
RDW: 14.2 % (ref 11.5–15.5)
WBC: 10 10*3/uL (ref 4.0–10.5)

## 2017-12-28 LAB — ABO/RH: ABO/RH(D): O POS

## 2017-12-28 LAB — GLUCOSE, CAPILLARY
Glucose-Capillary: 105 mg/dL — ABNORMAL HIGH (ref 65–99)
Glucose-Capillary: 99 mg/dL (ref 65–99)
Glucose-Capillary: 71 mg/dL (ref 65–99)
Glucose-Capillary: 66 mg/dL (ref 65–99)

## 2017-12-28 MED ORDER — DIPHENHYDRAMINE HCL 50 MG/ML IJ SOLN
12.5000 mg | Freq: Once | INTRAMUSCULAR | Status: AC
  Administered 2017-12-28: 12.5 mg via INTRAVENOUS
  Filled 2017-12-28: qty 1

## 2017-12-28 MED ORDER — LIDOCAINE-EPINEPHRINE 2 %-1:100000 IJ SOLN
INTRAMUSCULAR | Status: DC | PRN
  Administered 2017-12-28: 4 mL via INTRADERMAL

## 2017-12-28 MED ORDER — LIDOCAINE HCL (PF) 1 % IJ SOLN
30.0000 mL | INTRAMUSCULAR | Status: AC | PRN
  Administered 2017-12-29: 30 mL via SUBCUTANEOUS
  Filled 2017-12-28 (×2): qty 30

## 2017-12-28 MED ORDER — OXYTOCIN 40 UNITS IN LACTATED RINGERS INFUSION - SIMPLE MED
2.5000 [IU]/h | INTRAVENOUS | Status: DC
  Filled 2017-12-28: qty 1000

## 2017-12-28 MED ORDER — BUPIVACAINE HCL (PF) 0.25 % IJ SOLN
INTRAMUSCULAR | Status: DC | PRN
  Administered 2017-12-28 (×2): 4 mL via EPIDURAL

## 2017-12-28 MED ORDER — FENTANYL CITRATE (PF) 100 MCG/2ML IJ SOLN
INTRAMUSCULAR | Status: DC | PRN
  Administered 2017-12-28: 100 ug via EPIDURAL
  Administered 2017-12-28 (×2): 50 ug via EPIDURAL
  Administered 2017-12-28: 100 ug via EPIDURAL

## 2017-12-28 MED ORDER — OXYTOCIN BOLUS FROM INFUSION
500.0000 mL | Freq: Once | INTRAVENOUS | Status: AC
  Administered 2017-12-29: 500 mL via INTRAVENOUS

## 2017-12-28 MED ORDER — ACETAMINOPHEN 325 MG PO TABS: 650.0000 mg | ORAL_TABLET | ORAL | Status: DC | PRN

## 2017-12-28 MED ORDER — LACTATED RINGERS IV SOLN
INTRAVENOUS | Status: DC
  Administered 2017-12-28 (×5): via INTRAVENOUS

## 2017-12-28 MED ORDER — LACTATED RINGERS IV SOLN
500.0000 mL | INTRAVENOUS | Status: DC | PRN
  Administered 2017-12-28 (×2): 500 mL via INTRAVENOUS

## 2017-12-28 MED ORDER — SOD CITRATE-CITRIC ACID 500-334 MG/5ML PO SOLN: 30.0000 mL | ORAL | Status: DC | PRN

## 2017-12-28 MED ORDER — FENTANYL CITRATE (PF) 100 MCG/2ML IJ SOLN
INTRAMUSCULAR | Status: AC
  Filled 2017-12-28: qty 2

## 2017-12-28 MED ORDER — FENTANYL CITRATE (PF) 100 MCG/2ML IJ SOLN
100.0000 ug | INTRAMUSCULAR | Status: DC | PRN
  Administered 2017-12-28: 100 ug via INTRAVENOUS
  Filled 2017-12-28 (×2): qty 2

## 2017-12-28 MED ORDER — ONDANSETRON HCL 4 MG/2ML IJ SOLN: 4.0000 mg | Freq: Four times a day (QID) | INTRAMUSCULAR | Status: DC | PRN

## 2017-12-28 MED ORDER — FLEET ENEMA 7-19 GM/118ML RE ENEM: 1.0000 | ENEMA | RECTAL | Status: DC | PRN

## 2017-12-28 MED ORDER — DIPHENHYDRAMINE HCL 50 MG/ML IJ SOLN: 12.5000 mg | INTRAMUSCULAR | Status: DC | PRN

## 2017-12-28 MED ORDER — EPHEDRINE 5 MG/ML INJ
10.0000 mg | INTRAVENOUS | Status: DC | PRN
  Filled 2017-12-28: qty 2

## 2017-12-28 MED ORDER — EPHEDRINE 5 MG/ML INJ
10.0000 mg | INTRAVENOUS | Status: DC | PRN
  Administered 2017-12-28: 10 mg via INTRAVENOUS
  Filled 2017-12-28: qty 2

## 2017-12-28 MED ORDER — PHENYLEPHRINE 40 MCG/ML (10ML) SYRINGE FOR IV PUSH (FOR BLOOD PRESSURE SUPPORT)
80.0000 ug | PREFILLED_SYRINGE | INTRAVENOUS | Status: AC | PRN
  Administered 2017-12-28 (×3): 80 ug via INTRAVENOUS

## 2017-12-28 MED ORDER — OXYTOCIN 40 UNITS IN LACTATED RINGERS INFUSION - SIMPLE MED
1.0000 m[IU]/min | INTRAVENOUS | Status: DC
  Administered 2017-12-28: 2 m[IU]/min via INTRAVENOUS

## 2017-12-28 MED ORDER — SODIUM BICARBONATE 8.4 % IV SOLN
INTRAVENOUS | Status: DC | PRN
  Administered 2017-12-28: 3 mL via EPIDURAL
  Administered 2017-12-28: 4 mL via EPIDURAL

## 2017-12-28 MED ORDER — LACTATED RINGERS IV SOLN
500.0000 mL | Freq: Once | INTRAVENOUS | Status: AC
  Administered 2017-12-28: 500 mL via INTRAVENOUS

## 2017-12-28 MED ORDER — OXYCODONE-ACETAMINOPHEN 5-325 MG PO TABS: 1.0000 | ORAL_TABLET | ORAL | Status: DC | PRN

## 2017-12-28 MED ORDER — FENTANYL 2.5 MCG/ML BUPIVACAINE 1/10 % EPIDURAL INFUSION (WH - ANES)
14.0000 mL/h | INTRAMUSCULAR | Status: DC | PRN
  Administered 2017-12-28 (×3): 14 mL/h via EPIDURAL
  Filled 2017-12-28 (×3): qty 100

## 2017-12-28 MED ORDER — PENICILLIN G POT IN DEXTROSE 60000 UNIT/ML IV SOLN
3.0000 10*6.[IU] | INTRAVENOUS | Status: DC
  Administered 2017-12-28 (×4): 3 10*6.[IU] via INTRAVENOUS
  Filled 2017-12-28 (×12): qty 50

## 2017-12-28 MED ORDER — PHENYLEPHRINE 40 MCG/ML (10ML) SYRINGE FOR IV PUSH (FOR BLOOD PRESSURE SUPPORT)
PREFILLED_SYRINGE | INTRAVENOUS | Status: AC
  Filled 2017-12-28: qty 10

## 2017-12-28 MED ORDER — OXYCODONE-ACETAMINOPHEN 5-325 MG PO TABS: 2.0000 | ORAL_TABLET | ORAL | Status: DC | PRN

## 2017-12-28 MED ORDER — PENICILLIN G POTASSIUM 5000000 UNITS IJ SOLR
5.0000 10*6.[IU] | Freq: Once | INTRAVENOUS | Status: AC
  Administered 2017-12-28: 5 10*6.[IU] via INTRAVENOUS
  Filled 2017-12-28: qty 5

## 2017-12-28 MED ORDER — TERBUTALINE SULFATE 1 MG/ML IJ SOLN
0.2500 mg | Freq: Once | INTRAMUSCULAR | Status: DC | PRN
  Filled 2017-12-28: qty 1

## 2017-12-28 MED ORDER — PHENYLEPHRINE 40 MCG/ML (10ML) SYRINGE FOR IV PUSH (FOR BLOOD PRESSURE SUPPORT)
80.0000 ug | PREFILLED_SYRINGE | INTRAVENOUS | Status: AC | PRN
  Administered 2017-12-28 (×3): 80 ug via INTRAVENOUS
  Filled 2017-12-28 (×2): qty 10

## 2017-12-28 NOTE — Anesthesia Procedure Notes (Signed)
Epidural Patient location during procedure: OB Start time: 12/28/2017 4:03 AM  Staffing Anesthesiologist: Odette FractionHarkins, Erleen Egner, MD Performed: anesthesiologist   Preanesthetic Checklist Completed: patient identified, site marked, surgical consent, pre-op evaluation, timeout performed, IV checked, risks and benefits discussed and monitors and equipment checked  Epidural Patient position: sitting Prep: DuraPrep Patient monitoring: heart rate, continuous pulse ox and blood pressure Location: L3-L4 Injection technique: LOR saline  Needle:  Needle type: Tuohy  Needle gauge: 17 G Needle length: 9 cm and 9 Catheter type: closed end flexible Catheter size: 20 Guage Catheter at skin depth: 15 and 1 cm Test dose: negative and 2% lidocaine with Epi 1:200 K  Assessment Events: blood not aspirated, injection not painful, no injection resistance, negative IV test and no paresthesia  Additional Notes Patient identified. Risks/Benefits/Options discussed with patient including but not limited to bleeding, infection, nerve damage, paralysis, failed block, incomplete pain control, headache, blood pressure changes, nausea, vomiting, reactions to medication both or allergic, itching and postpartum back pain. Confirmed with bedside nurse the patient's most recent platelet count. Confirmed with patient that they are not currently taking any anticoagulation, have any bleeding history or any family history of bleeding disorders. Patient expressed understanding and wished to proceed. All questions were answered. Sterile technique was used throughout the entire procedure. Please see nursing notes for vital signs. Test dose was given through epidural catheter and negative prior to continuing to dose epidural or start infusion. Patient was given instructions on fall risk and not to get out of bed. All questions and concerns addressed with instructions to call with any issues.

## 2017-12-28 NOTE — Progress Notes (Signed)
LABOR PROGRESS NOTE  Linda JollyCourtney Lattanzio is a 27 y.o. G1P0 at 8444w3d  admitted for SOL   Subjective: Patient is starting to feel some pressure during contractions, reports pain in right hip doing contraction, request PCA on epidural  Objective: BP 129/72   Pulse 91   Temp 98 F (36.7 C) (Axillary)   Resp 18   Ht 5\' 5"  (1.651 m)   Wt 227 lb (103 kg)   LMP 03/27/2017   BMI 37.77 kg/m  or  Vitals:   12/28/17 1900 12/28/17 1945 12/28/17 2001 12/28/17 2030  BP: (!) 132/94 134/73 127/71 129/72  Pulse: (!) 127 77 73 91  Resp: 18 18 18 18   Temp:   98 F (36.7 C)   TempSrc:   Axillary   Weight:      Height:         Dilation: Lip/rim Effacement (%): 90(anterior cervix swelling) Station: 0 Presentation: Vertex Exam by:: Doroteo GlassmanPhelps, MD, Babette RelicLeeland, MD Resident FHT: baseline rate 140, moderate varibility, + acel, no decel Toco: 1.5-325minutes, IUPC in place- MVU 120-130  Labs: Lab Results  Component Value Date   WBC 10.0 12/28/2017   HGB 11.9 (L) 12/28/2017   HCT 35.6 (L) 12/28/2017   MCV 91.5 12/28/2017   PLT 267 12/28/2017    Patient Active Problem List   Diagnosis Date Noted  . Normal labor 12/28/2017  . GBS (group B Streptococcus carrier), +RV culture, currently pregnant 12/06/2017  . Gestational diabetes mellitus, antepartum 10/10/2017  . Supervision of high risk pregnancy, antepartum, third trimester 06/18/2017    Assessment / Plan: 27 y.o. G1P0 at 7944w3d here for SOL  Labor: Progressing well, anterior cervix swollen- benadryl 12.5mg  IV given for swelling with position change on left with peanut  Fetal Wellbeing:  Cat 1 Pain Control:  Epidural  Anticipated MOD:  SVD   Sharyon CableRogers, Antonela Freiman C, CNM 12/28/2017, 8:43 PM

## 2017-12-28 NOTE — Progress Notes (Signed)
Linda JollyCourtney Reyes is a 27 y.o. G1P0 at 5765w3d by LMP admitted for SOL.  Subjective: Patient is comfortable with epidural.   Objective: BP 111/62   Pulse (!) 105   Temp 98.2 F (36.8 C) (Oral)   Resp 16   Ht 5\' 5"  (1.651 m)   Wt 227 lb (103 kg)   LMP 03/27/2017   BMI 37.77 kg/m  No intake/output data recorded. No intake/output data recorded.  FHT:  FHR: 150 bpm, variability: moderate,  accelerations:  Present,  decelerations:  Absent UC:   regular, every 6-8 minutes SVE:   Dilation: 8 Effacement (%): 90 Station: -1 Exam by:: Linda GlassmanPhelps, MD  Labs: Lab Results  Component Value Date   WBC 10.0 12/28/2017   HGB 11.9 (L) 12/28/2017   HCT 35.6 (L) 12/28/2017   MCV 91.5 12/28/2017   PLT 267 12/28/2017    Assessment / Plan: Spontaneous labor, progressing normally  Labor: Progressing normally, AROM'd and will start pitocin for augmentation since contractions are spaced out GDM: q4h CBG checks, have been well controlled.  Fetal Wellbeing:  Category I Pain Control:  Epidural I/D:  GBS+, PCN Anticipated MOD:  NSVD  Linda AdaJazma Phelps, DO 12/28/2017, 10:55 AM

## 2017-12-28 NOTE — H&P (Signed)
OBSTETRIC ADMISSION HISTORY AND PHYSICAL   Linda Reyes is a 27 y.o. female G1P0 with IUP at 6510w3d by LMP presenting for SOL with pain and contractions. She reports +FMs, no LOF or VB.  She plans on breastfeeding. She is undecided regarding birth control in postpartum period.    [facility]   Dating: By LMP --->  Estimated Date of Delivery: 01/01/18  Sono:   @[redacted]w[redacted]d , CWD, normal female anatomy, cephalic presentation,  2532g, 59%tile  EFW.    Prenatal History/Complications: gestational DM (did not check sugars; felt it was an incorrect dx; recent A1C 6.2), GBS+   Past Medical History: Past Medical History:  Diagnosis Date  . Allergy   . Gestational diabetes    Past Surgical History: Past Surgical History:  Procedure Laterality Date  . WISDOM TOOTH EXTRACTION  2017    Obstetrical History: OB History    Gravida Para Term Preterm AB Living   1             SAB TAB Ectopic Multiple Live Births                  Social History: Social History   Socioeconomic History  . Marital status: Single    Spouse name: None  . Number of children: None  . Years of education: None  . Highest education level: None  Social Needs  . Financial resource strain: None  . Food insecurity - worry: None  . Food insecurity - inability: None  . Transportation needs - medical: None  . Transportation needs - non-medical: None  Occupational History  . None  Tobacco Use  . Smoking status: Former Smoker    Types: Cigarettes    Last attempt to quit: 05/13/2017    Years since quitting: 0.6  . Smokeless tobacco: Never Used  Substance and Sexual Activity  . Alcohol use: Yes    Alcohol/week: 0.0 oz    Comment: occasionally  . Drug use: No  . Sexual activity: Yes  Other Topics Concern  . None  Social History Narrative  . None    Family History: Family History  Problem Relation Age of Onset  . Prostate cancer Maternal Grandfather     Allergies: No Known  Allergies  Medications Prior to Admission  Medication Sig Dispense Refill Last Dose  . acetaminophen (TYLENOL) 500 MG tablet Take 1,000 mg by mouth every 6 (six) hours as needed for moderate pain.   12/27/2017 at Unknown time  . Prenatal Vit w/Fe-Methylfol-FA (PNV PO) Take 1 tablet by mouth daily.    12/27/2017 at Unknown time  . terconazole (TERAZOL 3) 0.8 % vaginal cream Place 1 applicator vaginally at bedtime. (Patient not taking: Reported on 12/25/2017) 20 g 0 Not Taking    Review of Systems   All systems reviewed and negative except as stated in HPI  Blood pressure 132/73, pulse 71, temperature 98.3 F (36.8 C), resp. rate 18, height 5\' 5"  (1.651 m), weight 103 kg (227 lb), last menstrual period 03/27/2017. General appearance: alert and no distress Lungs: clear to auscultation bilaterally Heart: regular rate and rhythm Abdomen: soft, non-tender; bowel sounds normal Pelvic: adequate Extremities: Homans sign is negative, no sign of DVT Presentation: cephalic Fetal monitoringBaseline: 130 bpm, Variability: Good {> 6 bpm), Accelerations: Reactive and Decelerations: Absent Uterine activityFrequency: Every 10 minutes Dilation: 6.5 Effacement (%): 80 Station: -2 Exam by:: Rockwell GermanyLauren Fields RN  Prenatal labs: ABO, Rh: --/--/O POS (01/12 [id number]) Antibody: PENDING (01/12 [id number])  Rubella: 11.00 (07/03 1626) RPR: Non Reactive (10/24 1055)  HBsAg: Negative (07/03 1626)  HIV: Non Reactive (10/24 1055)  GBS: Positive (12/19 1629)  GTT third t/m: 93-164-85 Genetic screening : quad neg  Anatomy US: normal female   Prenatal Transfer Tool  Maternal Diabetes: No, GDM Genetic Screening: Normal  Maternal Ultrasounds/Referrals: Normal Fetal Ultrasounds or other Referrals:  None Maternal Substance Abuse:  No Significant Maternal Medications:  None Significant Maternal Lab Results: GBS+   Results for orders placed or performed during the hospital encounter of 12/28/17 (from the past 24 hour(s))   CBC   Collection Time: 12/28/17  2:37 AM  Result Value Ref Range   WBC 10.0 4.0 - 10.5 K/uL   RBC 3.89 3.87 - 5.11 MIL/uL   Hemoglobin 11.9 (L) 12.0 - 15.0 g/dL   HCT 16.1 (L) 09.6 - 04.5 %   MCV 91.5 78.0 - 100.0 fL   MCH 30.6 26.0 - 34.0 pg   MCHC 33.4 30.0 - 36.0 g/dL   RDW 40.9 81.1 - 91.4 %   Platelets 267 150 - 400 K/uL  Type and screen [facility]   Collection Time: 12/28/17  2:37 AM  Result Value Ref Range   ABO/RH(D) O POS    Antibody Screen PENDING    Sample Expiration 12/31/2017    Patient Active Problem List   Diagnosis Date Noted  . Normal labor 12/28/2017  . GBS (group B Streptococcus carrier), +RV culture, currently pregnant 12/06/2017  . Gestational diabetes mellitus, antepartum 10/10/2017  . Supervision of high risk pregnancy, antepartum, third trimester 06/18/2017    Assessment/Plan:  Linda Reyes is a 27 y.o. G1P0 at [redacted]w[redacted]d here for SOL with pain and contractions.   #Labor: SOL; progressing normally - Anticipate NSVD.  #Pain: epidural #FWB: Cat I #ID:  GBS+, PCN  #MOF: breast #MOC:undecided  #Circ:  n/a  [name], MD  12/28/2017, 4:05 AM  CNM attestation:  I have seen and examined this patient; I agree with above documentation in the resident's note.   Linda Reyes is a 27 y.o. G1P0 here for SOL  PE: BP (!) 94/48   Pulse 88   Temp 98.3 F (36.8 C)   Resp 16   Ht 5\' 5"  (1.651 m)   Wt 103 kg (227 lb)   LMP 03/27/2017   BMI 37.77 kg/m   Resp: normal effort, no distress Abd: gravid  ROS, labs, PMH reviewed  Plan: Admit to [facility] Expectant management PCN for GBS ppx CBGs q 2-4 hrs Anticipate SVD  SHAW, KIMBERLY CNM 12/28/2017, 6:14 AM

## 2017-12-28 NOTE — Progress Notes (Signed)
Linda Reyes is a 27 y.o. G1P0 at 3218w3d by LMP admitted for SOL with pain and contractions.   Subjective: Patient is comfortable after having epidural. States her IV site is hurting her from the antibiotic but otherwise doing well.  She reports good fetal movement.  Patient's mother is at bedside.   Objective: BP 100/65   Pulse 88   Temp 98.3 F (36.8 C)   Resp (P) 16   Ht 5\' 5"  (1.651 m)   Wt 103 kg (227 lb)   LMP 03/27/2017   BMI 37.77 kg/m  No intake/output data recorded. No intake/output data recorded.  FHT:  FHR: 130 bpm, variability: moderate,  accelerations:  Present,  decelerations:  Absent UC:   regular, every 4 minutes SVE:   Dilation: 6.5 Effacement (%): 80 Station: -2 Exam by:: Rockwell GermanyLauren Fields RN  Labs: Lab Results  Component Value Date   WBC 10.0 12/28/2017   HGB 11.9 (L) 12/28/2017   HCT 35.6 (L) 12/28/2017   MCV 91.5 12/28/2017   PLT 267 12/28/2017    Assessment / Plan: Spontaneous labor, progressing normally  Labor: Progressing normally, will hold off on Pitocin for now Fetal Wellbeing:  Category I Pain Control:  Epidural I/D:  GBS+, PCN Anticipated MOD:  NSVD  Freddrick MarchYashika Kaleem Sartwell, MD  12/28/2017, 6:20 AM

## 2017-12-28 NOTE — Progress Notes (Signed)
Linda Reyes is a 27 y.o. G1P0 at 7842w3d by LMP admitted for SOL.  Subjective: Patient complaining of back pain.   Objective: BP 115/62   Pulse 93   Temp 98.6 F (37 C) (Oral)   Resp 16   Ht 5\' 5"  (1.651 m)   Wt 227 lb (103 kg)   LMP 03/27/2017   BMI 37.77 kg/m  No intake/output data recorded. No intake/output data recorded.  FHT:  FHR: 145 bpm, variability: moderate,  accelerations:  Present,  decelerations:  Absent UC:   regular, every 2-3 minutes SVE:   Dilation: 8.5 Effacement (%): 90 Station: -1 Exam by:: Lajuana Matteina Jacobs, RN  AROM @1040  clear  Pit @ 7414mu/min  Labs: Lab Results  Component Value Date   WBC 10.0 12/28/2017   HGB 11.9 (L) 12/28/2017   HCT 35.6 (L) 12/28/2017   MCV 91.5 12/28/2017   PLT 267 12/28/2017    Assessment / Plan: Augmentation of labor, progressing well  Labor: Progressing slowly on Pitocin, will continue to increase. IUPC in place and MVUs are inadequate GDM: q4h CBG checks, have been well controlled.  Fetal Wellbeing:  Category I Pain Control:  Epidural I/D:  GBS+, PCN Anticipated MOD:  NSVD  Caryl AdaJazma Phelps, DO 12/28/2017, 2:28 PM

## 2017-12-28 NOTE — MAU Note (Signed)
Third time here with contractions since Friday. 4cm last sve. Having a lot of back pain with ctxs and nausea. No bleeding. States some fld came out when in bathtub but none since

## 2017-12-28 NOTE — Anesthesia Pain Management Evaluation Note (Signed)
  CRNA Pain Management Visit Note  Patient: Linda Reyes, 27 y.o., female  "Hello I am a [facility] of the [facility] at [facility]. We have an anesthesia team available at all times to provide care throughout the hospital, including epidural management and anesthesia for C-section. I don't know your plan for the delivery whether it a natural birth, water birth, IV sedation, nitrous supplementation, doula or epidural, but we want to meet your pain goals."   1.Was your pain managed to your expectations on prior hospitalizations?   Yes   2.What is your expectation for pain management during this hospitalization?     Epidural  3.How can we help you reach that goal? Nursing support and epidural  Record the patient's initial score and the patient's pain goal.   Pain: 10/10  Pain Goal: 0/10 [facility] wants you to be able to say your pain was always managed very well.  Salome ArntSterling, Keali Mccraw Marie 12/28/2017

## 2017-12-28 NOTE — Anesthesia Preprocedure Evaluation (Signed)
Anesthesia Evaluation  Patient identified by MRN, date of birth, ID band Patient awake    History of Anesthesia Complications Negative for: history of anesthetic complications  Airway Mallampati: I  TM Distance: >3 FB Neck ROM: Full    Dental   Pulmonary neg pneumonia , neg COPD, former smoker,    Pulmonary exam normal        Cardiovascular negative cardio ROS Normal cardiovascular exam Rhythm:Regular     Neuro/Psych negative neurological ROS  negative psych ROS   GI/Hepatic negative GI ROS, Neg liver ROS,   Endo/Other  diabetes  Renal/GU negative Renal ROS  negative genitourinary   Musculoskeletal negative musculoskeletal ROS (+)   Abdominal Normal abdominal exam  (+)   Peds negative pediatric ROS (+)  Hematology negative hematology ROS (+)   Anesthesia Other Findings   Reproductive/Obstetrics (+) Pregnancy                             Anesthesia Physical Anesthesia Plan  ASA: II  Anesthesia Plan: Epidural   Post-op Pain Management:    Induction:   PONV Risk Score and Plan:   Airway Management Planned:   Additional Equipment:   Intra-op Plan:   Post-operative Plan:   Informed Consent:   Plan Discussed with:   Anesthesia Plan Comments:         Anesthesia Quick Evaluation

## 2017-12-29 ENCOUNTER — Encounter (HOSPITAL_COMMUNITY): Payer: Self-pay

## 2017-12-29 DIAGNOSIS — Z3A39 39 weeks gestation of pregnancy: Secondary | ICD-10-CM

## 2017-12-29 DIAGNOSIS — O24420 Gestational diabetes mellitus in childbirth, diet controlled: Secondary | ICD-10-CM

## 2017-12-29 DIAGNOSIS — O99824 Streptococcus B carrier state complicating childbirth: Secondary | ICD-10-CM

## 2017-12-29 MED ORDER — DIPHENHYDRAMINE HCL 25 MG PO CAPS: 25.0000 mg | ORAL_CAPSULE | Freq: Four times a day (QID) | ORAL | Status: DC | PRN

## 2017-12-29 MED ORDER — IBUPROFEN 600 MG PO TABS
600.0000 mg | ORAL_TABLET | Freq: Four times a day (QID) | ORAL | Status: DC
  Administered 2017-12-29 – 2017-12-31 (×10): 600 mg via ORAL
  Filled 2017-12-29 (×10): qty 1

## 2017-12-29 MED ORDER — TETANUS-DIPHTH-ACELL PERTUSSIS 5-2.5-18.5 LF-MCG/0.5 IM SUSP: 0.5000 mL | Freq: Once | INTRAMUSCULAR | Status: DC

## 2017-12-29 MED ORDER — ACETAMINOPHEN 325 MG PO TABS
650.0000 mg | ORAL_TABLET | ORAL | Status: DC | PRN
Start: 2017-12-29 — End: 2017-12-31
  Administered 2017-12-29 – 2017-12-31 (×7): 650 mg via ORAL
  Filled 2017-12-29 (×9): qty 2

## 2017-12-29 MED ORDER — SENNOSIDES-DOCUSATE SODIUM 8.6-50 MG PO TABS
2.0000 | ORAL_TABLET | ORAL | Status: DC
  Administered 2017-12-29 – 2017-12-31 (×2): 2 via ORAL
  Filled 2017-12-29 (×2): qty 2

## 2017-12-29 MED ORDER — COCONUT OIL OIL: 1.0000 "application " | TOPICAL_OIL | Status: DC | PRN

## 2017-12-29 MED ORDER — ONDANSETRON HCL 4 MG/2ML IJ SOLN
4.0000 mg | INTRAMUSCULAR | Status: DC | PRN
Start: 2017-12-29 — End: 2017-12-31

## 2017-12-29 MED ORDER — SIMETHICONE 80 MG PO CHEW: 80.0000 mg | CHEWABLE_TABLET | ORAL | Status: DC | PRN

## 2017-12-29 MED ORDER — ONDANSETRON HCL 4 MG PO TABS: 4.0000 mg | ORAL_TABLET | ORAL | Status: DC | PRN

## 2017-12-29 MED ORDER — DIBUCAINE 1 % RE OINT
1.0000 "application " | TOPICAL_OINTMENT | RECTAL | Status: DC | PRN
  Administered 2017-12-29 – 2017-12-31 (×2): 1 via RECTAL
  Filled 2017-12-29 (×2): qty 28

## 2017-12-29 MED ORDER — PRENATAL MULTIVITAMIN CH
1.0000 | ORAL_TABLET | Freq: Every day | ORAL | Status: DC
  Administered 2017-12-29 – 2017-12-31 (×3): 1 via ORAL
  Filled 2017-12-29 (×3): qty 1

## 2017-12-29 MED ORDER — BENZOCAINE-MENTHOL 20-0.5 % EX AERO
1.0000 "application " | INHALATION_SPRAY | CUTANEOUS | Status: DC | PRN
  Administered 2017-12-29: 1 via TOPICAL
  Filled 2017-12-29: qty 56

## 2017-12-29 MED ORDER — ZOLPIDEM TARTRATE 5 MG PO TABS: 5.0000 mg | ORAL_TABLET | Freq: Every evening | ORAL | Status: DC | PRN

## 2017-12-29 MED ORDER — WITCH HAZEL-GLYCERIN EX PADS
1.0000 "application " | MEDICATED_PAD | CUTANEOUS | Status: DC | PRN
  Administered 2017-12-29 – 2017-12-31 (×2): 1 via TOPICAL

## 2017-12-29 NOTE — Progress Notes (Signed)
Encouraged mom to get up to use bathroom: refused.

## 2017-12-29 NOTE — Progress Notes (Addendum)
POSTPARTUM PROGRESS NOTE  Post Partum Day 1 Subjective:  Linda Reyes is a 27 y.o. G1P1001 207w4d s/p SVD.  No acute events overnight.  Pt denies problems with ambulating, voiding or po intake.  She denies nausea or vomiting.  Pain is well controlled. Lochia Minimal.   Objective: Blood pressure (!) 105/54, pulse 72, temperature 98.1 F (36.7 C), temperature source Oral, resp. rate 18, height 5\' 5"  (1.651 m), weight 102.3 kg (225 lb 8 oz), last menstrual period 03/27/2017, SpO2 100 %, unknown if currently breastfeeding.  Physical Exam:  General: alert, cooperative and no distress Lochia:normal flow Chest: no respiratory distress Heart:regular rate, distal pulses intact Abdomen: soft, nontender,  Uterine Fundus: firm, appropriately tender DVT Evaluation: No calf swelling or tenderness Extremities: no edema  Recent Labs    12/28/17 0237  HGB 11.9*  HCT 35.6*    Assessment/Plan:  ASSESSMENT: Linda Reyes is a 27 y.o. G1P1001 337w4d s/p SVD.  Plan for discharge tomorrow   LOS: 2 days   Emerlyn Mehlhoff MossDO 12/30/2017, 6:34 AM

## 2017-12-29 NOTE — Anesthesia Postprocedure Evaluation (Signed)
Anesthesia Post Note  Patient: Linda Reyes  Procedure(s) Performed: AN AD HOC LABOR EPIDURAL     Patient location during evaluation: Mother Baby Anesthesia Type: Epidural Level of consciousness: awake and alert and oriented Pain management: satisfactory to patient Vital Signs Assessment: post-procedure vital signs reviewed and stable Respiratory status: respiratory function stable Cardiovascular status: stable Postop Assessment: no headache, no backache, epidural receding, patient able to bend at knees, no signs of nausea or vomiting and adequate PO intake Anesthetic complications: no    Last Vitals:  Vitals:   12/29/17 0430 12/29/17 0823  BP: 125/63 (!) 102/55  Pulse: 86 78  Resp: 18 17  Temp: 37.4 C 36.7 C  SpO2: 100%     Last Pain:  Vitals:   12/29/17 0842  TempSrc:   PainSc: 3    Pain Goal:                 Betha Shadix

## 2017-12-30 ENCOUNTER — Encounter: Payer: Self-pay | Admitting: Certified Nurse Midwife

## 2017-12-30 ENCOUNTER — Ambulatory Visit (HOSPITAL_COMMUNITY): Payer: Medicaid Other

## 2017-12-30 NOTE — Progress Notes (Signed)
Pt complains of left knee pain  Tylenol is given and ice pack is applied to the left knee. No left knee swelling is noted.  Pt is taken to bathroom via stedy and pt request to ambulate form bathroom to bed. Pt ambulates /s problems. Dr Steffanie Rainwateregel is notified of the left knee pain.

## 2017-12-30 NOTE — Progress Notes (Addendum)
   PRENATAL VISIT NOTE  Subjective:  Linda Reyes is a 27 y.o. G1P1001 at 3121w6d being seen today for ongoing prenatal care.  She is currently monitored for the following issues for this high-risk pregnancy and has Supervision of high risk pregnancy, antepartum, third trimester; Gestational diabetes mellitus, antepartum; GBS (group B Streptococcus carrier), +RV culture, currently pregnant; and Normal labor on their problem list.  Patient reports no complaints.  Contractions: Not present. Vag. Bleeding: None.  Movement: Present. Denies leaking of fluid.   The following portions of the patient's history were reviewed and updated as appropriate: allergies, current medications, past family history, past medical history, past social history, past surgical history and problem list. Problem list updated.  Objective:   Vitals:   12/25/17 1558  BP: 124/76  Pulse: 99  Weight: 225 lb 6.4 oz (102.2 kg)    Fetal Status: Fetal Heart Rate (bpm): NST; reactive Fundal Height: 40 cm Movement: Present  Presentation: Vertex  General:  Alert, oriented and cooperative. Patient is in no acute distress.  Skin: Skin is warm and dry. No rash noted.   Cardiovascular: Normal heart rate noted  Respiratory: Normal respiratory effort, no problems with respiration noted  Abdomen: Soft, gravid, appropriate for gestational age.  Pain/Pressure: Present     Pelvic: Cervical exam deferred Dilation: 2 Effacement (%): 50 Station: -2  Extremities: Normal range of motion.  Edema: Trace  Mental Status:  Normal mood and affect. Normal behavior. Normal judgment and thought content.    NST: + accels, no decels, moderate variability, Cat. 1 tracing. No contractions on toco.   Assessment and Plan:  Pregnancy: G1P1001 at 1821w6d  1. Supervision of high risk pregnancy, antepartum, third trimester      Reactive NST - POCT Glucose (CBG) - Fetal nonstress test; Future  2. GBS (group B Streptococcus carrier), +RV culture,  currently pregnant     PCN for labor/delivery  3. Gestational diabetes mellitus (GDM), antepartum, gestational diabetes method of control unspecified     Medical non-complaince.  Random glucose: 122 - POCT Glucose (CBG) - Fetal nonstress test; Future - US MFM FETAL BPP WO NON STRESS; Future - US MFM OB FOLLOW UP; Future  4. Medical non-compliance     Is not checking her blood sugars.   - US MFM FETAL BPP WO NON STRESS; Future - US MFM OB FOLLOW UP; Future  Term labor symptoms and general obstetric precautions including but not limited to vaginal bleeding, contractions, leaking of fluid and fetal movement were reviewed in detail with the patient. Please refer to After Visit Summary for other counseling recommendations.  Return in about 5 days (around 12/30/2017) for NST, nurse visit only.   Roe Coombsachelle A Genea Rheaume, CNM

## 2017-12-30 NOTE — Progress Notes (Signed)
Interim Progress Note  S: Called by nursing stating patient complaining of left knee pain. Patient describes pain as 6/10 in severity. States she is able to bear weight but has continued pain. Patient states she has full ROM but has continued pain with both flexion and extension of knee. Patient has no prior history of knee trauma or pain.   O: Vitals:   12/29/17 0823 12/29/17 1619  BP: (!) 102/55 (!) 105/54  Pulse: 78 72  Resp: 17 18  Temp: 98.1 F (36.7 C) 98.1 F (36.7 C)  SpO2:     Knees:  right knee WNL.  left knee: No redness or swelling noted.  Good passive/active range of motion of knee but with medial knee pain.  Internal and external hip rotation good without pain.  Tenderness at medial or lateral joint line.  Unable to appreciate any joint effusion.  Anterior/posterior drawer tests, McMurray's, Lachmann's testing all negative. No tenderness to palpation of patella.   A/P: -will continue scheduled ibuprofen -ice/kpad prn  -if no pain relief with ice/heat and ibuprofen will re-assess for pain management  Linda ManisSherin Pruitt Taboada, DO PGY-1 12/30/2017 11:48 AM

## 2017-12-31 DIAGNOSIS — K649 Unspecified hemorrhoids: Secondary | ICD-10-CM

## 2017-12-31 LAB — GLUCOSE, CAPILLARY: Glucose-Capillary: 78 mg/dL (ref 65–99)

## 2017-12-31 MED ORDER — LIDOCAINE (ANORECTAL) 5 % EX GEL: 1.0000 "application " | CUTANEOUS | 0 refills | Status: AC | PRN

## 2017-12-31 NOTE — Discharge Summary (Signed)
OB Discharge Summary     Patient Name: Linda Reyes DOB: November 20, 1991 MRN: [id number]  Date of admission: 12/28/2017 Delivering MD: Catalina AntiguaONSTANT, PEGGY   Date of discharge: 12/31/2017  Admitting diagnosis: 39 wks ctx every 5 min Intrauterine pregnancy: 7085w4d     Secondary diagnosis:  Active Problems:   Normal labor  Additional problems: GDM, GBS     Discharge diagnosis: Term Pregnancy Delivered                                                                                                Post partum procedures:N/A  Augmentation: AROM and Pitocin  Complications: None 2 min Shoulder dystocia  Hospital course:  Onset of Labor With Vaginal Delivery     [age] y.o. yo G1P1001 at 8885w4d was admitted in Active Labor on 12/28/2017. Patient had an uncomplicated labor course as follows:  Membrane Rupture Time/Date: 10:39 AM ,12/28/2017   Intrapartum Procedures: Episiotomy: None [1]                                         Lacerations:  2nd degree [3];Perineal [11]  Patient had a delivery of a Viable infant, 2 min shoulder dystocia. Resolved with delivery of posterior arm. 12/29/2017  Information for the patient's newborn:  Linda Reyes, Girl Linda Reyes [[id number]][[id number]]  Delivery Method: Vaginal, Spontaneous(Filed from Delivery Summary)    Pateint had an uncomplicated postpartum course.  She is ambulating, tolerating a regular diet, passing flatus, and urinating well. Patient is discharged home in stable condition on 12/31/17.   Physical exam  Vitals:   12/29/17 1619 12/30/17 0912 12/30/17 1216 12/30/17 1743  BP: (!) 105/54  (!) 101/58 113/60  Pulse: 72  70 72  Resp: 18  18 18   Temp: 98.1 F (36.7 C)  97.7 F (36.5 C) 98.4 F (36.9 C)  TempSrc: Oral  Oral Oral  SpO2:      Weight:  100 kg (220 lb 6 oz)    Height:       General: alert Lochia: appropriate Uterine Fundus: firm Incision: Healing well with no significant drainage DVT Evaluation: No evidence of DVT seen on physical exam. Labs: Lab  Results  Component Value Date   WBC 10.0 12/28/2017   HGB 11.9 (L) 12/28/2017   HCT 35.6 (L) 12/28/2017   MCV 91.5 12/28/2017   PLT 267 12/28/2017   CMP Latest Ref Rng & Units 06/10/2017  Glucose 65 - 99 mg/dL 536(U110(H)  BUN 6 - 20 mg/dL 9  Creatinine 4.400.44 - 3.471.00 mg/dL 4.250.50  Sodium 956135 - 387145 mmol/L 131(L)  Potassium 3.5 - 5.1 mmol/L 3.9  Chloride 101 - 111 mmol/L 100(L)  CO2 22 - 32 mmol/L 23  Calcium 8.9 - 10.3 mg/dL 9.5  Total Protein 6.5 - 8.1 g/dL 7.3  Total Bilirubin 0.3 - 1.2 mg/dL 0.4  Alkaline Phos 38 - 126 U/L 51  AST 15 - 41 U/L 18  ALT 14 - 54 U/L 12(L)    Discharge instruction: per After Visit Summary and "  Baby and Me Booklet".  After visit meds:  Allergies as of 12/31/2017   No Known Allergies     Medication List    TAKE these medications   acetaminophen 500 MG tablet Commonly known as:  TYLENOL Take 1,000 mg by mouth every 6 (six) hours as needed for moderate pain.   Lidocaine (Anorectal) 5 % Gel Apply 1 application topically as needed.   PNV PO Take 1 tablet by mouth daily.       Diet: routine diet  Activity: Advance as tolerated. Pelvic rest for 6 weeks.   Outpatient follow up:6 weeks Pt needs 2 hr GTT Follow up Appt: Future Appointments  Date Time Provider Department Center  02/10/2018  8:45 AM [facility] None  02/10/2018  9:00 AM Constant, Peggy, MD [facility] None   Follow up Visit:No Follow-up on file.  Postpartum contraception: Undecided  Newborn Data: Live born female  Birth Weight: 8 lb 5 oz (3770 g) APGAR: 8, 9  Newborn Delivery   Time head delivered:  12/29/2017 00:06:00 Birth date/time:  12/29/2017 00:08:00 Delivery type:  Vaginal, Spontaneous     Baby Feeding: Bottle and Breast Disposition:home with mother   12/31/2017 Linda Reyes, Student-MidWife   I confirm that I have verified the information documented in the nurse midwife's note and that I have also personally reperformed the physical exam and all  medical decision making activities.  -reviewed with patient importance of sitz baths, stool softeners, tucks pads for hemorroid -encouraged high fiber diet -RX for lidocaine cream given  Linda Reyes

## 2018-01-01 ENCOUNTER — Telehealth: Payer: Self-pay

## 2018-01-01 ENCOUNTER — Inpatient Hospital Stay (HOSPITAL_COMMUNITY): Admission: RE | Admit: 2018-01-01 | Payer: Medicaid Other | Source: Ambulatory Visit

## 2018-01-01 LAB — RPR

## 2018-01-01 NOTE — Telephone Encounter (Signed)
Called patient to advice her of the need to redraw her RPR, she refused and said that it will get done when she has her next appt.

## 2018-01-01 NOTE — Telephone Encounter (Signed)
Already spoke with pt

## 2018-01-01 NOTE — Telephone Encounter (Signed)
-----   Message from Federico FlakeKimberly Niles Newton, MD sent at 01/01/2018 12:49 PM EST ----- Regarding: need RPR redrawn Patient's RPR in labor was too hemolyzed for analysis. Can you please have the patient come to the office to have redraw?  Discharged on 1/15, Delivered 1/13  Thanks, Selena BattenKim

## 2018-01-08 ENCOUNTER — Encounter: Payer: Self-pay | Admitting: Obstetrics and Gynecology

## 2018-01-08 ENCOUNTER — Ambulatory Visit (INDEPENDENT_AMBULATORY_CARE_PROVIDER_SITE_OTHER): Payer: Medicaid Other | Admitting: Obstetrics and Gynecology

## 2018-01-08 VITALS — BP 118/66 | HR 87 | Temp 97.9°F | Wt 216.0 lb

## 2018-01-08 DIAGNOSIS — G8918 Other acute postprocedural pain: Secondary | ICD-10-CM

## 2018-01-08 DIAGNOSIS — O1204 Gestational edema, complicating childbirth: Secondary | ICD-10-CM

## 2018-01-08 DIAGNOSIS — K640 First degree hemorrhoids: Secondary | ICD-10-CM

## 2018-01-08 DIAGNOSIS — O9089 Other complications of the puerperium, not elsewhere classified: Secondary | ICD-10-CM

## 2018-01-08 MED ORDER — IBUPROFEN 800 MG PO TABS: 800.0000 mg | ORAL_TABLET | Freq: Three times a day (TID) | ORAL | 1 refills | Status: DC | PRN

## 2018-01-08 MED ORDER — TRIAMTERENE-HCTZ 37.5-25 MG PO TABS: 1.0000 | ORAL_TABLET | Freq: Every day | ORAL | 0 refills | Status: AC

## 2018-01-08 MED ORDER — OXYCODONE-ACETAMINOPHEN 5-325 MG PO TABS
1.0000 | ORAL_TABLET | Freq: Four times a day (QID) | ORAL | 0 refills | Status: AC | PRN
Start: 2018-01-08 — End: ?

## 2018-01-08 MED ORDER — HYDROCORTISONE 2.5 % RE CREA: 1.0000 "application " | TOPICAL_CREAM | Freq: Two times a day (BID) | RECTAL | 0 refills | Status: AC

## 2018-01-08 NOTE — Progress Notes (Signed)
Patient ID: Yolande JollyCourtney Kurt, female   DOB: Oct 22, 1991, 27 y.o.   MRN: [id number] Ms. Hart RochesterLawson presents today as W/I for c/o edema, hemorrhoids and episiotomy pain. She is S/P TSVD with 2 degree laceration on 12/29/17. She has been using OTC Tylenol and Morin for the pain.  She denies constipation, using a stool softener. Breast/Bottle feeding No N/V/F/C Denies HA or visual changes  PE AF VSS Lungs clear Heart RRR Abd soft + BS GU nl EGBUS episiotomy site healing well no evidence of infection Small hemorrhoids noted Ext 1 + edema  A/P Edema        Hemorrhoids        Episiotomy pain  Sitz baths recommended to pt. Motrin 800 mg, Maxzide 25 x 1 week, Percocet # 20 no refills and Anusol cream sent to pharmacy for pt. To keep routine post partum appt.

## 2018-01-08 NOTE — Progress Notes (Signed)
Pt presents for problem visit today.  Pt delivered 12/29/17 (vaginal)  CC:

## 2018-02-10 ENCOUNTER — Ambulatory Visit: Payer: Medicaid Other | Admitting: Obstetrics and Gynecology

## 2018-02-10 ENCOUNTER — Other Ambulatory Visit: Payer: Medicaid Other

## 2018-02-18 ENCOUNTER — Encounter: Payer: Self-pay | Admitting: Obstetrics & Gynecology

## 2018-02-18 ENCOUNTER — Ambulatory Visit (INDEPENDENT_AMBULATORY_CARE_PROVIDER_SITE_OTHER): Payer: Medicaid Other | Admitting: Obstetrics & Gynecology

## 2018-02-18 DIAGNOSIS — Z1389 Encounter for screening for other disorder: Secondary | ICD-10-CM

## 2018-02-18 MED ORDER — TRAMADOL-ACETAMINOPHEN 37.5-325 MG PO TABS
1.0000 | ORAL_TABLET | Freq: Four times a day (QID) | ORAL | 0 refills | Status: AC | PRN
Start: 2018-02-18 — End: ?

## 2018-02-18 MED ORDER — PRENATAL GUMMIES/DHA & FA 0.4-32.5 MG PO CHEW: 1.0000 | CHEWABLE_TABLET | Freq: Every day | ORAL | 12 refills | Status: AC

## 2018-02-18 NOTE — Progress Notes (Signed)
Patient states that she does not want BC today, complains of vaginal pain and requests prenatal gummies to be sent to pharmacy. Pt requests to return to work tomorrow.   Marland Kitchen..Post Partum Exam  Linda JollyCourtney Reyes is a 27 y.o. 211P1001 female who presents for a postpartum visit. She is 7 weeks postpartum following a spontaneous vaginal delivery. I have fully reviewed the prenatal and intrapartum course. The delivery was at 39.4 gestational weeks.  Anesthesia: epidural. Postpartum course has been good. Baby's course has been good. Baby is feeding by bottle Rush Barer- Gerber. Bleeding staining only. Bowel function is normal. Bladder function is normal. Patient is not sexually active. Contraception method is none. Postpartum depression screening:neg  The following portions of the patient's history were reviewed and updated as appropriate: allergies, current medications, past family history, past medical history, past social history, past surgical history and problem list. Last pap smear done 06-18-17 and was Normal  Review of Systems pain at perineum    Objective:  unknown if currently breastfeeding.  General:  alert, cooperative and no distress   Breasts:     Lungs: effort normal  Heart:     Abdomen: soft, non-tender; bowel sounds normal; no masses,  no organomegaly   Vulva:  perineal laceration tender but not inflammed or swollen  Vagina: normal vagina  Cervix:     Corpus: not examined  Adnexa:  not evaluated  Rectal Exam: Not performed.        Assessment:    7 week postpartum exam. Pap smear not done at today's visit.   Plan:   1. Contraception: abstinence 2. Asks for pain medication other than ibuprofen. Rx Ultracet 20 tabs sent to Rx, recommend no refill 3. Follow up as needed.   Adam PhenixArnold, James G, MD 02/18/2018

## 2018-02-18 NOTE — Patient Instructions (Signed)
Postpartum Care After Vaginal Delivery °The period of time right after you deliver your newborn is called the postpartum period. °What kind of medical care will I receive? °· You may continue to receive fluids and medicines through an IV tube inserted into one of your veins. °· If an incision was made near your vagina (episiotomy) or if you had some vaginal tearing during delivery, cold compresses may be placed on your episiotomy or your tear. This helps to reduce pain and swelling. °· You may be given a squirt bottle to use when you go to the bathroom. You may use this until you are comfortable wiping as usual. To use the squirt bottle, follow these steps: °? Before you urinate, fill the squirt bottle with warm water. Do not use hot water. °? After you urinate, while you are sitting on the toilet, use the squirt bottle to rinse the area around your urethra and vaginal opening. This rinses away any urine and blood. °? You may do this instead of wiping. As you start healing, you may use the squirt bottle before wiping yourself. Make sure to wipe gently. °? Fill the squirt bottle with clean water every time you use the bathroom. °· You will be given sanitary pads to wear. °How can I expect to feel? °· You may not feel the need to urinate for several hours after delivery. °· You will have some soreness and pain in your abdomen and vagina. °· If you are breastfeeding, you may have uterine contractions every time you breastfeed for up to several weeks postpartum. Uterine contractions help your uterus return to its normal size. °· It is normal to have vaginal bleeding (lochia) after delivery. The amount and appearance of lochia is often similar to a menstrual period in the first week after delivery. It will gradually decrease over the next few weeks to a dry, yellow-brown discharge. For most women, lochia stops completely by 6-8 weeks after delivery. Vaginal bleeding can vary from woman to woman. °· Within the first few  days after delivery, you may have breast engorgement. This is when your breasts feel heavy, full, and uncomfortable. Your breasts may also throb and feel hard, tightly stretched, warm, and tender. After this occurs, you may have milk leaking from your breasts. Your health care provider can help you relieve discomfort due to breast engorgement. Breast engorgement should go away within a few days. °· You may feel more sad or worried than normal due to hormonal changes after delivery. These feelings should not last more than a few days. If these feelings do not go away after several days, speak with your health care provider. °How should I care for myself? °· Tell your health care provider if you have pain or discomfort. °· Drink enough water to keep your urine clear or pale yellow. °· Wash your hands thoroughly with soap and water for at least 20 seconds after changing your sanitary pads, after using the toilet, and before holding or feeding your baby. °· If you are not breastfeeding, avoid touching your breasts a lot. Doing this can make your breasts produce more milk. °· If you become weak or lightheaded, or you feel like you might faint, ask for help before: °? Getting out of bed. °? Showering. °· Change your sanitary pads frequently. Watch for any changes in your flow, such as a sudden increase in volume, a change in color, the passing of large blood clots. If you pass a blood clot from your vagina, save it   to show to your health care provider. Do not flush blood clots down the toilet without having your health care provider look at them. °· Make sure that all your vaccinations are up to date. This can help protect you and your baby from getting certain diseases. You may need to have immunizations done before you leave the hospital. °· If desired, talk with your health care provider about methods of family planning or birth control (contraception). °How can I start bonding with my baby? °Spending as much time as  possible with your baby is very important. During this time, you and your baby can get to know each other and develop a bond. Having your baby stay with you in your room (rooming in) can give you time to get to know your baby. Rooming in can also help you become comfortable caring for your baby. Breastfeeding can also help you bond with your baby. °How can I plan for returning home with my baby? °· Make sure that you have a car seat installed in your vehicle. °? Your car seat should be checked by a certified car seat installer to make sure that it is installed safely. °? Make sure that your baby fits into the car seat safely. °· Ask your health care provider any questions you have about caring for yourself or your baby. Make sure that you are able to contact your health care provider with any questions after leaving the hospital. °This information is not intended to replace advice given to you by your health care provider. Make sure you discuss any questions you have with your health care provider. °Document Released: 09/30/2007 Document Revised: 05/07/2016 Document Reviewed: 11/07/2015 °[employer] Interactive Patient Education © 2018 [employer] ° °

## 2018-09-22 LAB — HM PAP SMEAR: HM Pap smear: NEGATIVE

## 2019-05-03 IMAGING — US US MFM OB COMP +14 WKS
1 series · 14 of 28 positions shown · non-contrast
Comparison: none

[Series 1: us mfm ob comp +14 wks · 68 acquisitions, 14 frames shown]
[im 3/68]
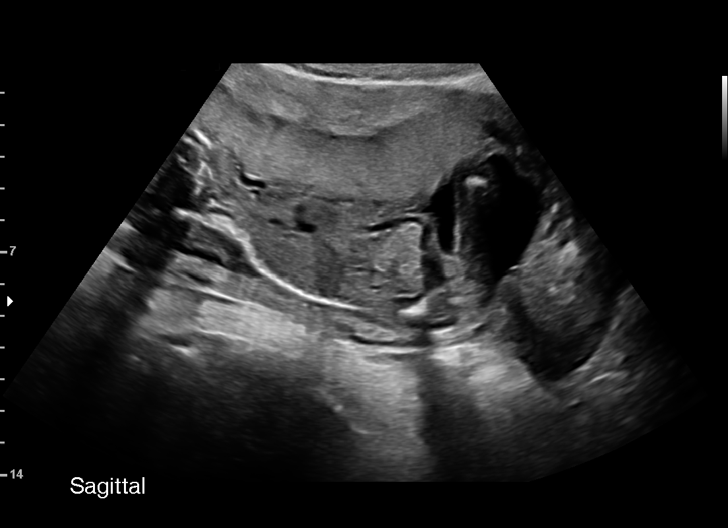
[im 8/68]
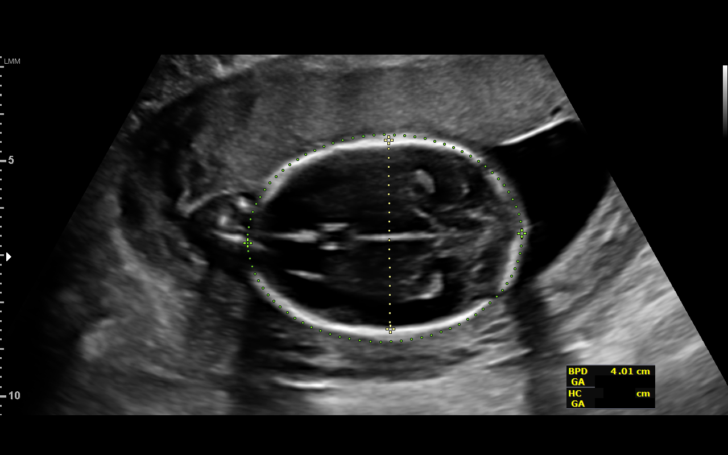
[im 13/68]
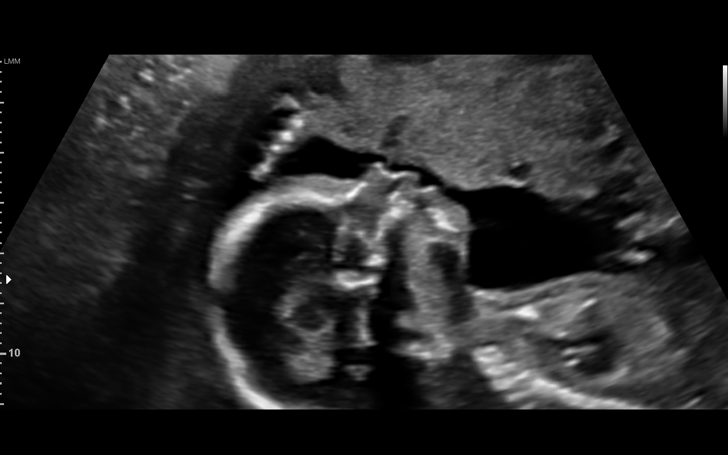
[im 18/68]
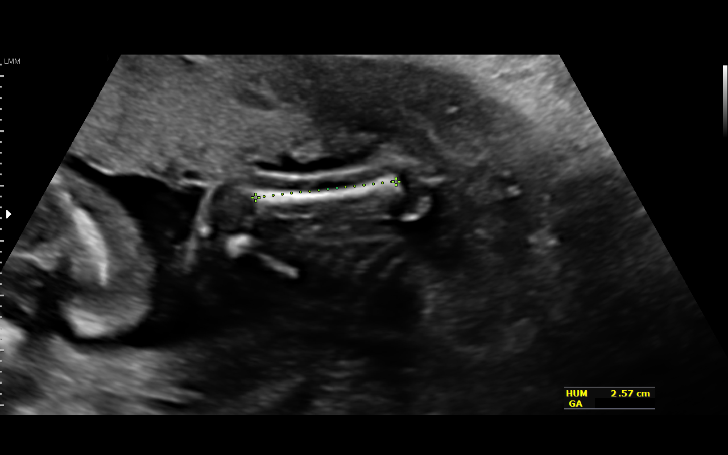
[im 23/68]
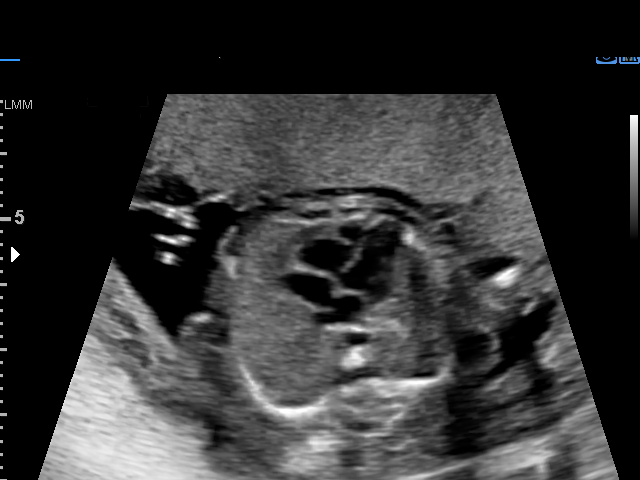
[im 28/68]
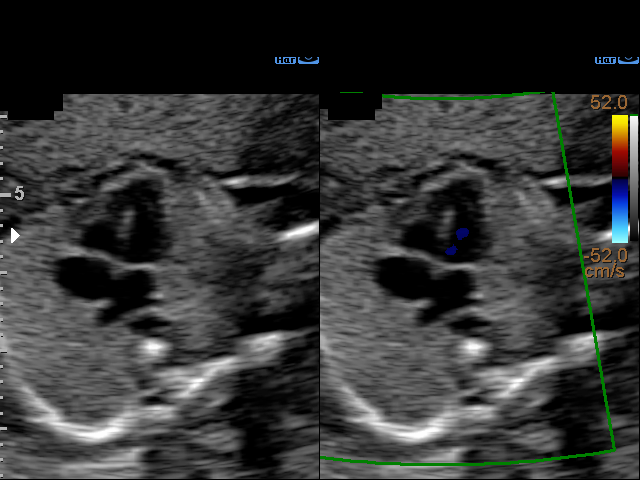
[im 33/68]
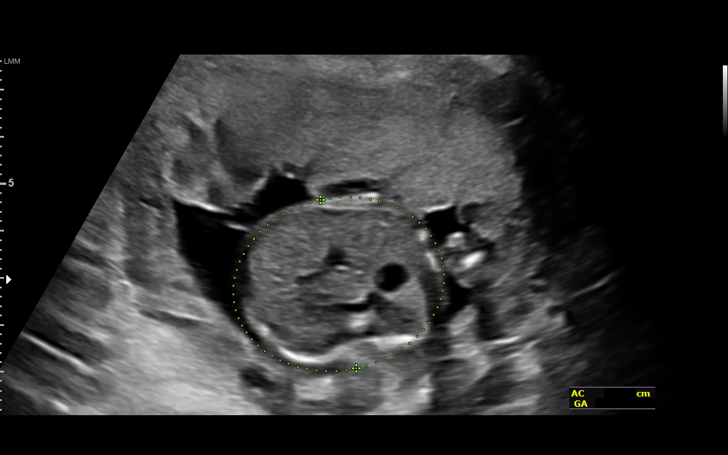
[im 38/68]
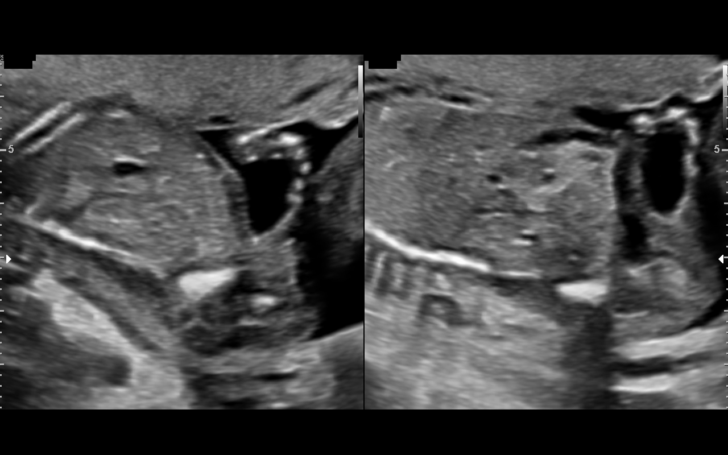
[im 43/68]
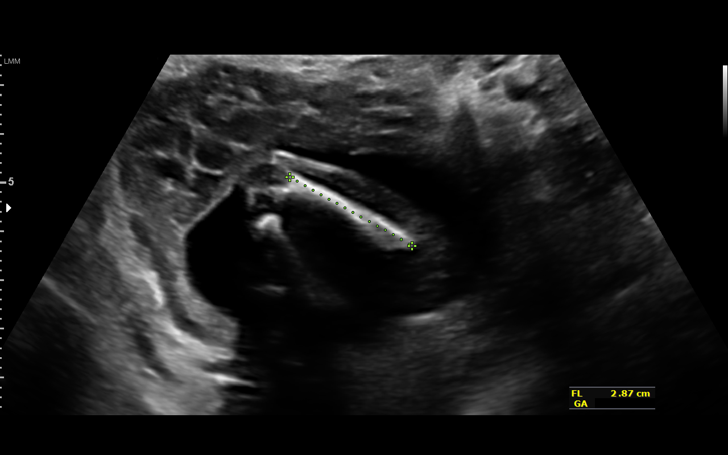
[im 48/68]
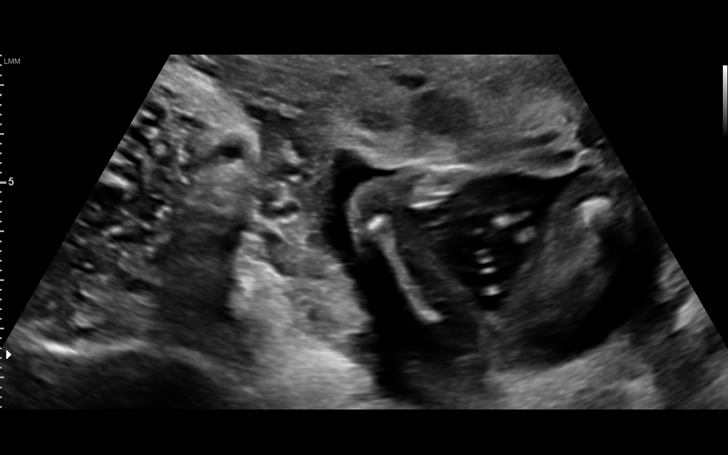
[im 53/68]
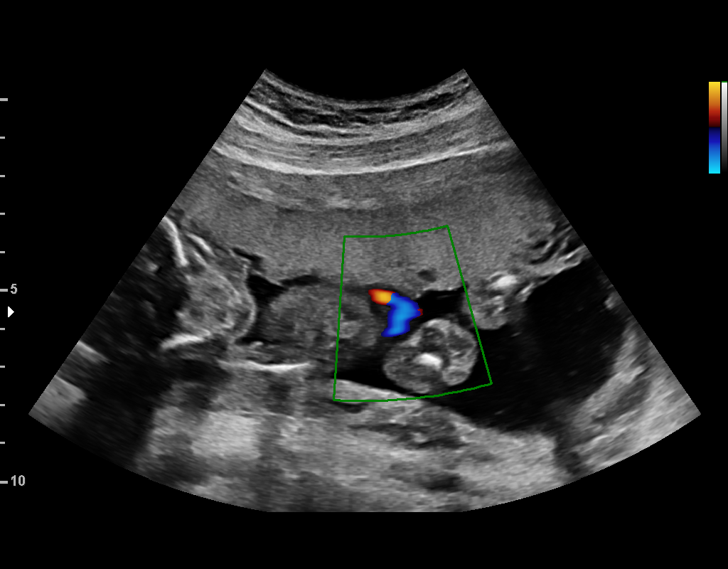
[im 58/68]
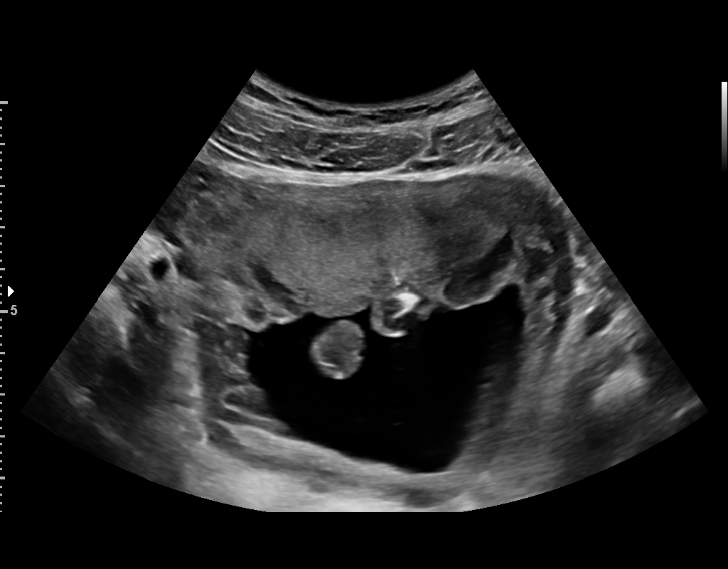
[im 63/68]
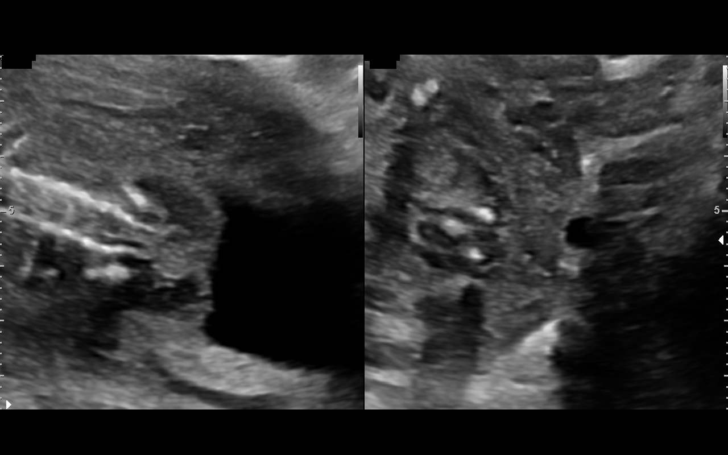
[im 68/68]
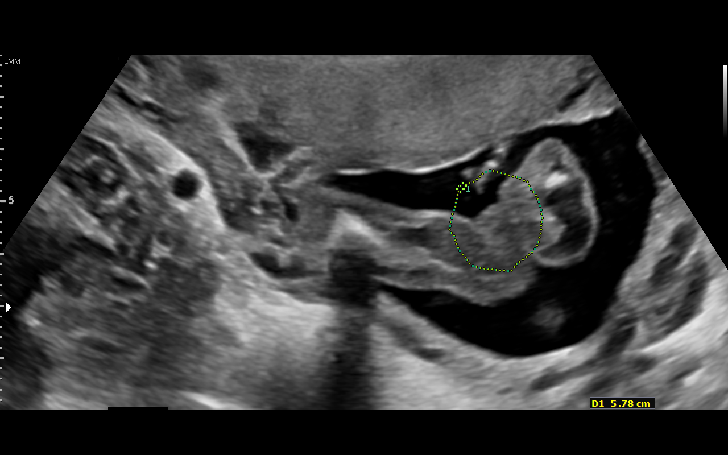

[14 of 28 positions shown; findings below may reference images not displayed]

OB/Gyn Clinic

1  ITO ZUMAYA           [id number]      [id number]     [id number]
Indications

19 weeks gestation of pregnancy
Encounter for fetal anatomic survey
OB History

Gravidity:    1
Fetal Evaluation

Num Of Fetuses:     1
Fetal Heart         150
Rate(bpm):
Cardiac Activity:   Observed
Presentation:       Breech
Placenta:           Anterior, above cervical os
P. Cord Insertion:  Visualized, central

Amniotic Fluid
AFI FV:      Subjectively within normal limits

Largest Pocket(cm)
2.7
Biometry

BPD:      40.2  mm     G. Age:  18w 1d         11  %    CI:        64.48   %   70 - 86
FL/HC:      17.6   %   16.1 -
HC:       161   mm     G. Age:  18w 6d         25  %    HC/AC:      1.21       1.09 -
AC:      132.8  mm     G. Age:  18w 5d         29  %    FL/BPD:     70.6   %
FL:       28.4  mm     G. Age:  18w 5d         24  %    FL/AC:      21.4   %   20 - 24
HUM:        26  mm     G. Age:  18w 1d         21  %
CER:      18.2  mm     G. Age:  18w 1d         15  %
NFT:         3  mm

CM:        4.7  mm
Est. FW:     255  gm      0 lb 9 oz     35  %
Gestational Age

LMP:           19w 2d       Date:   03/27/17                 EDD:   01/01/18
U/S Today:     18w 4d                                        EDD:   01/06/18
Best:          19w 2d    Det. By:   LMP  (03/27/17)          EDD:   01/01/18
Anatomy

Cranium:               Appears normal         Aortic Arch:            Appears normal
Cavum:                 Appears normal         Ductal Arch:            Appears normal
Ventricles:            Appears normal         Diaphragm:              Appears normal
Choroid Plexus:        Appears normal         Stomach:                Appears normal, left
sided
Cerebellum:            Appears normal         Abdomen:                Appears normal
Posterior Fossa:       Appears normal         Abdominal Wall:         Appears nml (cord
insert, abd wall)
Nuchal Fold:           Appears normal         Cord Vessels:           Appears normal (3
vessel cord)
Face:                  Appears normal         Kidneys:                Appear normal
(orbits and profile)
Lips:                  Appears normal         Bladder:                Appears normal
Thoracic:              Appears normal         Spine:                  Limited views
appear normal
Heart:                 Appears normal         Upper Extremities:      Appears normal
(4CH, axis, and situs
RVOT:                  Appears normal         Lower Extremities:      Appears normal
LVOT:                  Appears normal

Other:  Fetus appears to be a female. Heels appear normal. Technically
difficult due to fetal position.
Cervix Uterus Adnexa

Cervix
Length:           3.61  cm.
Normal appearance by transabdominal scan.

Uterus
No abnormality visualized.

Left Ovary
Within normal limits.

Right Ovary
Within normal limits.

Cul De Sac:   No free fluid seen.

Adnexa:       No abnormality visualized.
Impression

Single living intrauterine pregnancy at 96w9d.
Anterior placenta without evidence of previa.
Appropriate fetal growth.
Normal amniotic fluid volume.
Limited views of the fetal spine.
The fetal anatomic survey is otherwise complete.
Normal fetal anatomy.
No fetal anomalies or soft markers of aneuploidy seen.
Recommendations

Recommend follow-up ultrasound examination in 6 weeks for
reevaluation of fetal spine.

## 2019-07-10 IMAGING — US US MFM OB FOLLOW-UP
1 series · 14 of 28 positions shown · non-contrast
Comparison: none

[Series 1: us mfm ob follow-up · 14 of 40 slices shown]
[im 2/40]
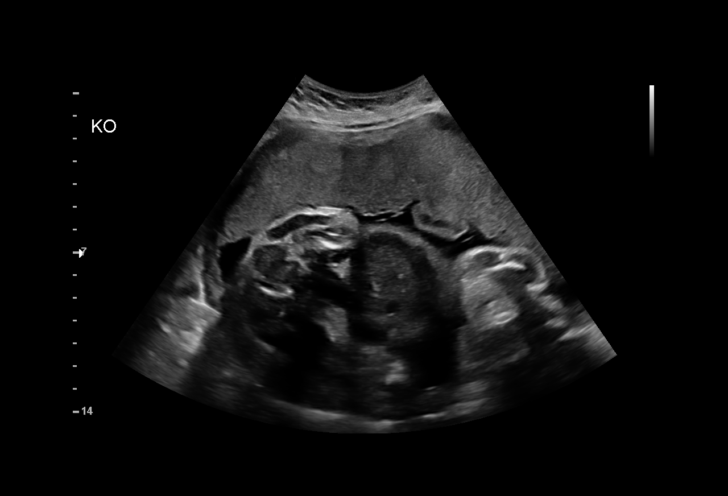
[im 5/40]
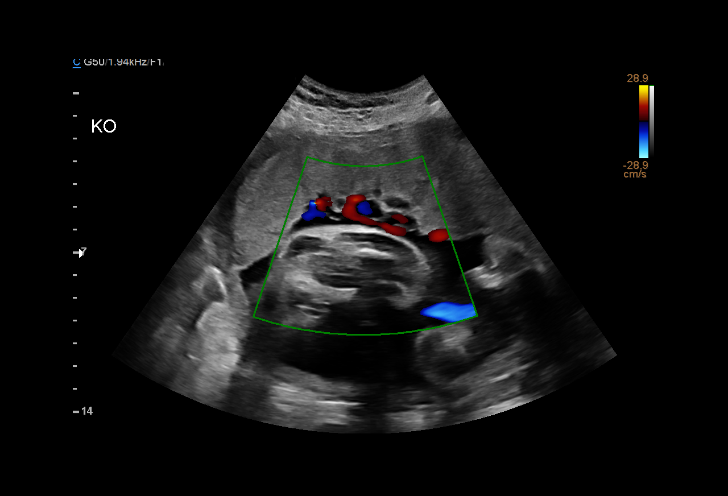
[im 8/40]
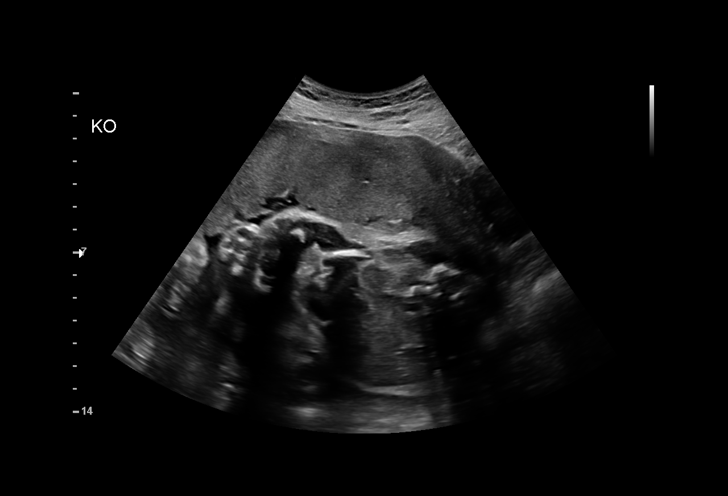
[im 11/40]
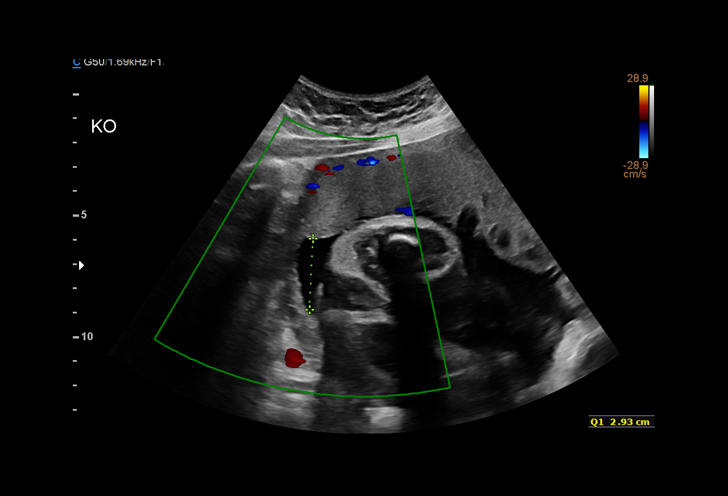
[im 14/40]
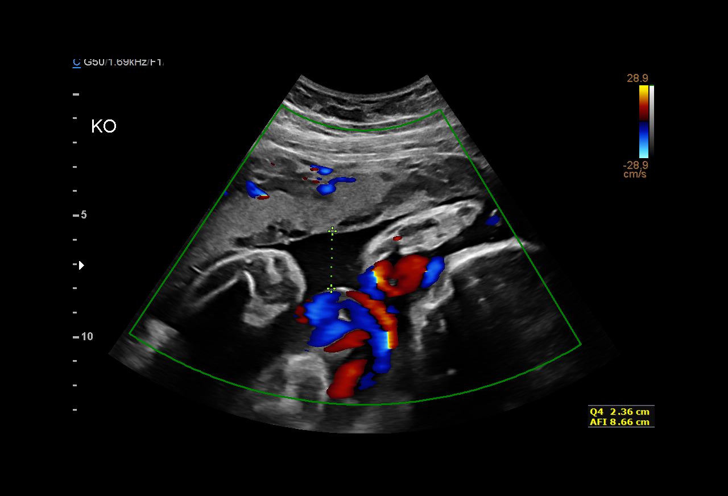
[im 16/40]
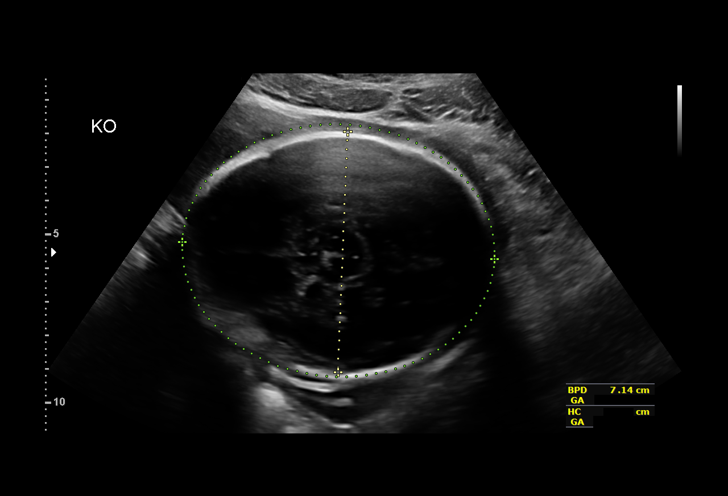
[im 19/40]
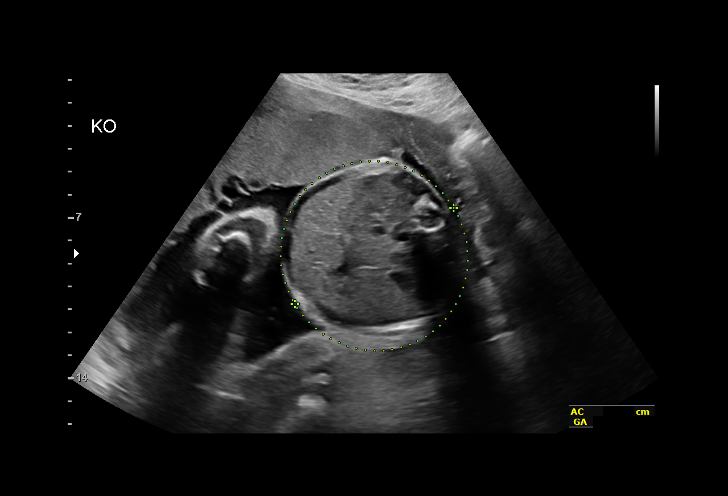
[im 22/40]
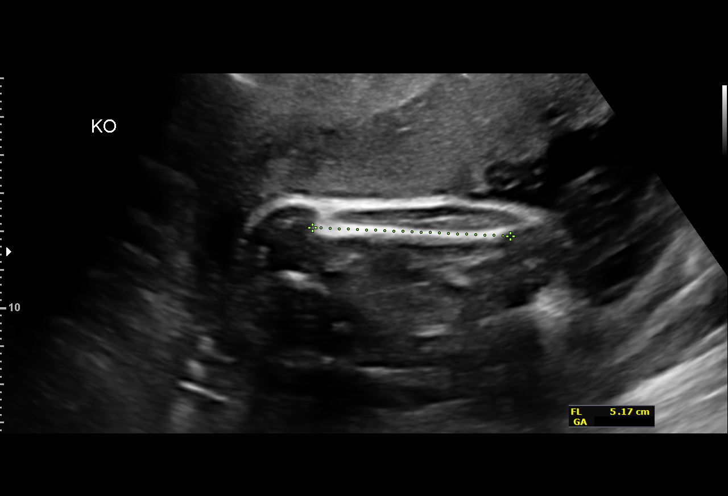
[im 25/40]
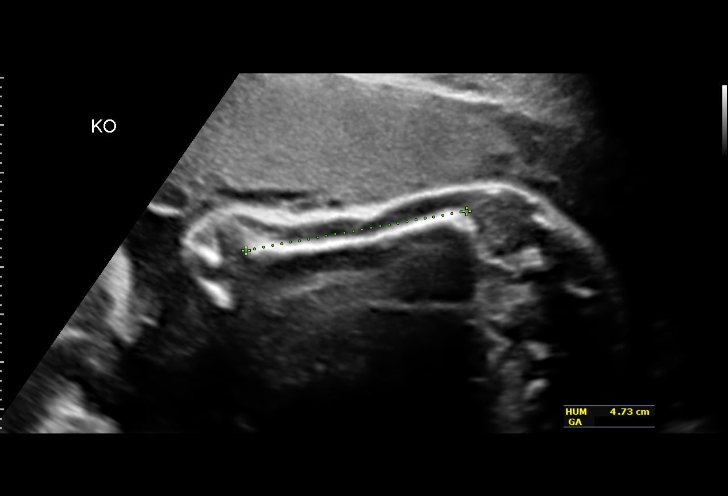
[im 28/40]
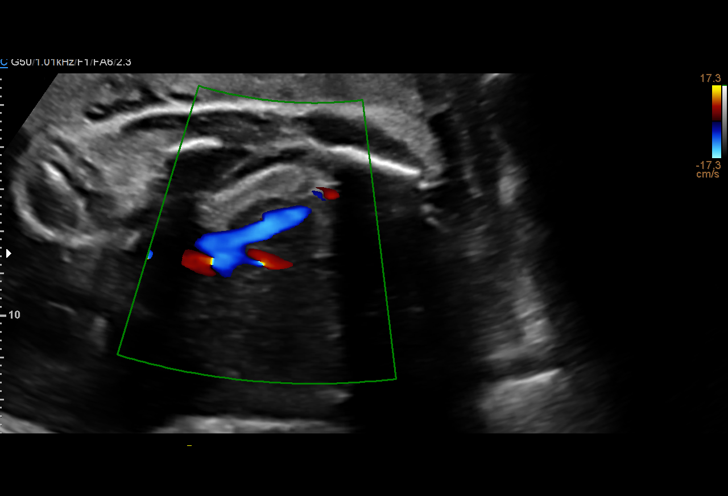
[im 31/40]
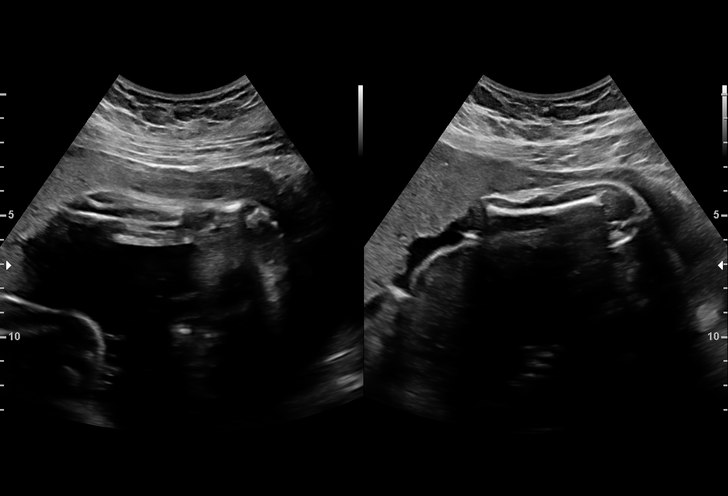
[im 34/40]
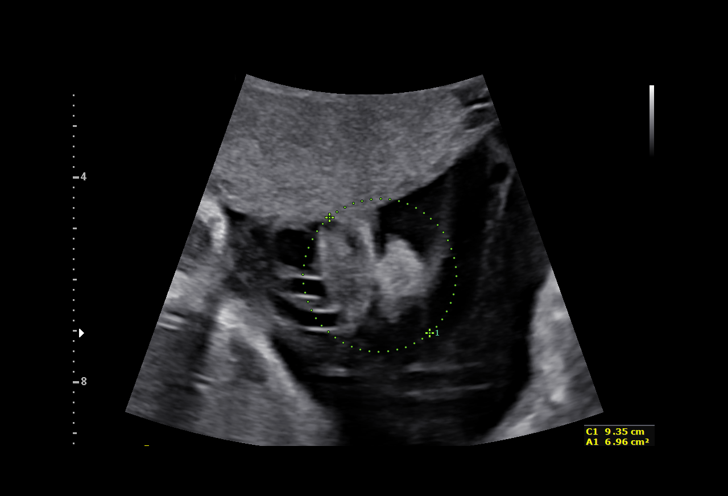
[im 37/40]
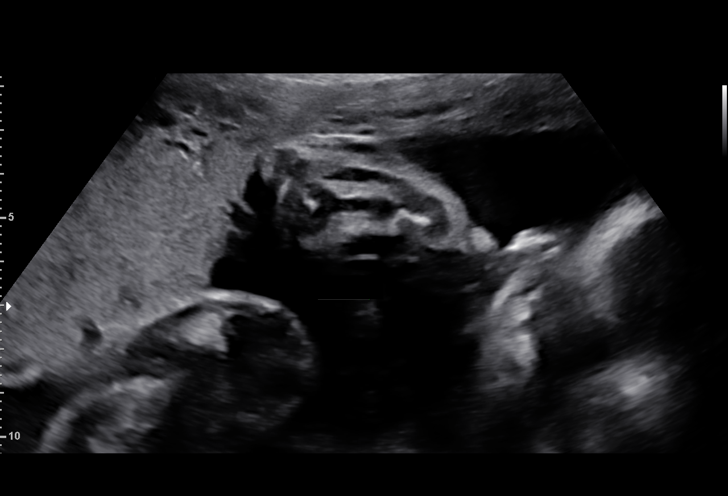
[im 40/40]
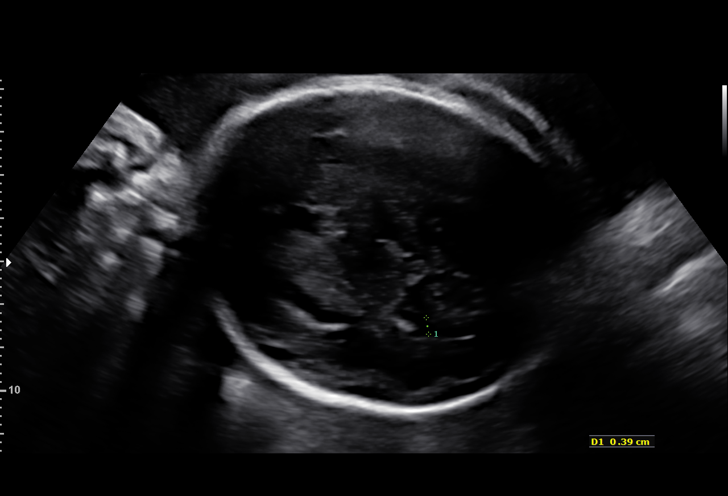

[14 of 28 positions shown; findings below may reference images not displayed]

OB/Gyn Clinic

1  BIOLUKE ADAMCOVA           [id number]      [id number]     [id number]
Indications

29 weeks gestation of pregnancy
Gestational diabetes in pregnancy,
unspecified control
OB History

Gravidity:    1
Fetal Evaluation

Num Of Fetuses:     1
Fetal Heart         145
Rate(bpm):
Cardiac Activity:   Observed
Presentation:       Cephalic
Placenta:           Anterior, above cervical os

Amniotic Fluid
AFI FV:      Subjectively within normal limits

AFI Sum(cm)     %Tile       Largest Pocket(cm)
8.66            4

RUQ(cm)       RLQ(cm)       LUQ(cm)        LLQ(cm)
2.93
Biometry
BPD:      71.8  mm     G. Age:  28w 6d         32  %    CI:        75.36   %    70 - 86
FL/HC:      19.4   %    19.6 -
HC:      262.3  mm     G. Age:  28w 4d         10  %    HC/AC:      1.02        0.99 -
AC:      256.2  mm     G. Age:  29w 6d         67  %    FL/BPD:     71.0   %    71 - 87
FL:         51  mm     G. Age:  27w 2d          5  %    FL/AC:      19.9   %    20 - 24
HUM:      46.9  mm     G. Age:  27w 4d         15  %

Est. FW:    3664  gm    2 lb 13 oz      47  %
Gestational Age

LMP:           29w 0d        Date:  03/27/17                 EDD:   01/01/18
U/S Today:     28w 5d                                        EDD:   01/03/18
Best:          29w 0d     Det. By:  LMP  (03/27/17)          EDD:   01/01/18
Anatomy

Cranium:               Appears normal         Aortic Arch:            Previously seen
Cavum:                 Previously seen        Ductal Arch:            Previously seen
Ventricles:            Appears normal         Diaphragm:              Previously seen
Choroid Plexus:        Previously seen        Stomach:                Appears normal, left
sided
Cerebellum:            Previously seen        Abdomen:                Previously seen
Posterior Fossa:       Previously seen        Abdominal Wall:         Previously seen
Nuchal Fold:           Previously seen        Cord Vessels:           Previously seen
Face:                  Orbits and profile     Kidneys:                Appear normal
previously seen
Lips:                  Previously seen        Bladder:                Appears normal
Thoracic:              Appears normal         Spine:                  Appears normal
Heart:                 Appears normal         Upper Extremities:      Previously seen
(4CH, axis, and situs
RVOT:                  Previously seen        Lower Extremities:      Previously seen
LVOT:                  Appears normal

Other:  Fetus appears to be a female. Heels appear normal previously.
Technically difficult due to fetal position.
Impression

Single living intrauterine pregnancy at 29w 0d.
Cephalic presentation.
Placenta Anterior, above cervical os.
Normal amniotic fluid volume.
Appropriate interval fetal growth.
Normal interval fetal anatomy.
Recommendations

Recommend follow-up ultrasound examination in 6 weeks for
fetal growth.
If medical management is required for gestational diabetes,
recommend antenatal testing beginning at 32 weeks.

## 2019-08-25 ENCOUNTER — Ambulatory Visit: Payer: Self-pay

## 2019-09-02 ENCOUNTER — Other Ambulatory Visit: Payer: Self-pay

## 2019-09-02 ENCOUNTER — Ambulatory Visit (LOCAL_COMMUNITY_HEALTH_CENTER): Payer: Medicaid Other

## 2019-09-02 VITALS — BP 111/73 | Ht 63.25 in | Wt 213.5 lb

## 2019-09-02 DIAGNOSIS — Z30013 Encounter for initial prescription of injectable contraceptive: Secondary | ICD-10-CM

## 2019-09-02 DIAGNOSIS — Z3009 Encounter for other general counseling and advice on contraception: Secondary | ICD-10-CM | POA: Diagnosis not present

## 2019-09-02 MED ORDER — MEDROXYPROGESTERONE ACETATE 150 MG/ML IM SUSP
150.0000 mg | Freq: Once | INTRAMUSCULAR | Status: AC
  Administered 2019-09-02: 150 mg via INTRAMUSCULAR

## 2019-09-02 MED ORDER — MULTI-VITAMIN/MINERALS PO TABS: 1.0000 | ORAL_TABLET | Freq: Every day | ORAL | 0 refills | Status: AC

## 2019-09-02 NOTE — Progress Notes (Signed)
Client not in main waiting room lobby, first floor bathroom or small waiting room on Nurse Clinic hallway. Rich Number, RN  Client inadvertently checked in when above note was written. Presented to Nurse Clinic for Depo and tolerated injection without difficulty. Client requested Depo in right arm today. MVI counseling completed and vitamins given. Client is currently taking [employer] brand allergy medicine, but does not know name of allergy medication. Rich Number, RN

## 2019-12-03 ENCOUNTER — Other Ambulatory Visit: Payer: Self-pay

## 2019-12-03 ENCOUNTER — Ambulatory Visit: Payer: Medicaid Other

## 2019-12-03 ENCOUNTER — Ambulatory Visit (LOCAL_COMMUNITY_HEALTH_CENTER): Payer: Medicaid Other

## 2019-12-03 VITALS — BP 98/67 | Ht 63.25 in | Wt 213.0 lb

## 2019-12-03 DIAGNOSIS — Z30013 Encounter for initial prescription of injectable contraceptive: Secondary | ICD-10-CM

## 2019-12-03 DIAGNOSIS — Z3009 Encounter for other general counseling and advice on contraception: Secondary | ICD-10-CM | POA: Diagnosis not present

## 2019-12-03 DIAGNOSIS — Z3042 Encounter for surveillance of injectable contraceptive: Secondary | ICD-10-CM

## 2019-12-03 MED ORDER — MEDROXYPROGESTERONE ACETATE 150 MG/ML IM SUSP
150.0000 mg | Freq: Once | INTRAMUSCULAR | Status: AC
  Administered 2019-12-03: 150 mg via INTRAMUSCULAR

## 2019-12-03 NOTE — Progress Notes (Signed)
Last physical at [facility] was 09/22/2018. Last depo at [facility] was 09/02/2019; 13.1 weeks post depo today. Per [facility] records: 09/22/2018 PAP was NILM (yeast positive); PAP with reflex due 2022, CBD due 2022. Consulted with provider regarding pt request to continue with depo today. Per verbal order by Charlotte Sanes, PA, administered DMPA 150 mg IM today and pt to schedule physical when next depo is due.

## 2020-03-02 ENCOUNTER — Ambulatory Visit: Payer: Medicaid Other

## 2020-03-11 ENCOUNTER — Ambulatory Visit: Payer: Medicaid Other

## 2020-04-14 ENCOUNTER — Ambulatory Visit (LOCAL_COMMUNITY_HEALTH_CENTER): Payer: Medicaid Other | Admitting: Physician Assistant

## 2020-04-14 ENCOUNTER — Other Ambulatory Visit: Payer: Self-pay

## 2020-04-14 ENCOUNTER — Encounter: Payer: Self-pay | Admitting: Physician Assistant

## 2020-04-14 VITALS — BP 99/63 | Ht 63.25 in | Wt 206.6 lb

## 2020-04-14 DIAGNOSIS — Z30013 Encounter for initial prescription of injectable contraceptive: Secondary | ICD-10-CM

## 2020-04-14 DIAGNOSIS — Z309 Encounter for contraceptive management, unspecified: Secondary | ICD-10-CM | POA: Diagnosis not present

## 2020-04-14 LAB — WET PREP FOR TRICH, YEAST, CLUE
Trichomonas Exam: NEGATIVE
Yeast Exam: NEGATIVE

## 2020-04-14 LAB — PREGNANCY, URINE: Preg Test, Ur: NEGATIVE

## 2020-04-14 MED ORDER — MEDROXYPROGESTERONE ACETATE 150 MG/ML IM SUSP
150.0000 mg | Freq: Once | INTRAMUSCULAR | Status: AC
  Administered 2020-04-14: 150 mg via INTRAMUSCULAR

## 2020-04-14 MED ORDER — MEDROXYPROGESTERONE ACETATE 150 MG/ML IM SUSP
150.0000 mg | INTRAMUSCULAR | Status: AC
  Administered 2020-08-18 – 2021-03-02 (×3): 150 mg via INTRAMUSCULAR

## 2020-04-14 NOTE — Progress Notes (Signed)
[employer] results reviewed. Per standing orders no treatment no treatment indicated. UPT negative. Depo given and tolerated well. Tawny Hopping, RN

## 2020-04-14 NOTE — Progress Notes (Signed)
Here today for Depo and STD screening. Last PE and Pap Smear was 09/22/2018. Last Depo was 12/03/2019 (19.0 weeks.) Declines bloodwork. Tawny Hopping, RN

## 2020-04-14 NOTE — Progress Notes (Signed)
Family Planning Visit- Repeat Yearly Visit  Subjective:  Sheree Lalla is a 29 y.o. being seen today for an well woman visit and to discuss family planning options.    She is currently using abstinence for pregnancy prevention. Patient reports she does not want a pregnancy in the next year. Patient  has Hemorrhoids; Edema in pregnancy, delivered; and Second degree perineal laceration during delivery on their problem list.  Chief Complaint  Patient presents with  . Contraception    Patient reports she just forgot to return for DMPA. Likes it and wants to continue. LMP after most recent sex.  Patient denies vag discharge/odor/itch or STI exposure.    Does the patient desire a pregnancy in the next year? ([facility] flowsheet)  See flowsheet for other program required questions.   Body mass index is 36.31 kg/m. - Patient is eligible for diabetes screening based on BMI and age >15?  not applicable HA1C ordered? no  Patient reports 1 of partners in last year. Desires STI screening?  Yes  Does the patient have a current or past history of drug use? No   No components found for: HCV]   Health Maintenance Due  Topic Date Due  . COVID-19 Vaccine (1) Never done  . TETANUS/TDAP  Never done    Review of Systems  Constitutional: Negative.   HENT: Negative.   Eyes: Negative.   Respiratory: Negative.   Cardiovascular: Negative.   Gastrointestinal: Negative.   Genitourinary: Negative.   Musculoskeletal: Negative.   Skin: Negative.   Neurological: Negative.   Endo/Heme/Allergies: Negative.   Psychiatric/Behavioral: Negative.     The following portions of the patient's history were reviewed and updated as appropriate: allergies, current medications, past family history, past medical history, past social history, past surgical history and problem list. Problem list updated.  Objective:   Vitals:   04/14/20 1100  BP: 99/63  Weight: 206 lb 9.6 oz (93.7 kg)  Height: 5' 3.25" (1.607 m)     Physical Exam Constitutional:      Appearance: She is obese.  Pulmonary:     Effort: Pulmonary effort is normal.  Genitourinary:    General: Normal vulva.     Exam position: Lithotomy position.     Pubic Area: No rash.      Labia:        Right: No rash or lesion.        Left: No rash or lesion.      Urethra: No urethral swelling.     Vagina: No vaginal discharge, tenderness or lesions.     Cervix: No cervical motion tenderness or lesion.     Uterus: Not tender.      Adnexa:        Right: No tenderness.         Left: No tenderness.       Rectum: No external hemorrhoid.  Lymphadenopathy:     Lower Body: No right inguinal adenopathy. No left inguinal adenopathy.  Skin:    General: Skin is warm and dry.  Neurological:     Mental Status: She is alert and oriented to person, place, and time.  Psychiatric:        Behavior: Behavior normal.        Thought Content: Thought content normal.        Judgment: Judgment normal.       Assessment and Plan:  Pascale Maves is a 29 y.o. female presenting to the [facility] Department for an initial well woman  exam/family planning visit  Contraception counseling: Reviewed all forms of birth control options in the tiered based approach. available including abstinence; over the counter/barrier methods; hormonal contraceptive medication including pill, patch, ring, injection,contraceptive implant, ECP; hormonal and nonhormonal IUDs; permanent sterilization options including vasectomy and the various tubal sterilization modalities. Risks, benefits, and typical effectiveness rates were reviewed.  Questions were answered.  Written information was also given to the patient to review.  Patient desires DMPA, this was prescribed for patient. She will follow up in  13 weeks for surveillance.  She was told to call with any further questions, or with any concerns about this method of contraception.  Emphasized use of condoms 100% of the time  for STI prevention.  1. Encounter for contraceptive management, unspecified type If UPT = neg today, restart DMPA 150mg  IM today and repeat every 3 mo for 1 year. Treat wet prep if needed prn standing orders. - WET PREP FOR TRICH, YEAST, CLUE - Pregnancy, urine - Chlamydia/Gonorrhea [facility] - medroxyPROGESTERone (DEPO-PROVERA) injection 150 mg     No follow-ups on file.  No future appointments.  Lora Havens, PA-C

## 2020-06-14 ENCOUNTER — Other Ambulatory Visit: Payer: Self-pay

## 2020-06-14 ENCOUNTER — Ambulatory Visit (LOCAL_COMMUNITY_HEALTH_CENTER): Payer: Self-pay

## 2020-06-14 DIAGNOSIS — Z111 Encounter for screening for respiratory tuberculosis: Secondary | ICD-10-CM

## 2020-06-17 ENCOUNTER — Ambulatory Visit
Admission: RE | Admit: 2020-06-17 | Discharge: 2020-06-17 | Disposition: A | Discharge status: Home / Self Care | Payer: Medicaid Other | Source: Ambulatory Visit | Attending: Family Medicine | Admitting: Family Medicine

## 2020-06-17 ENCOUNTER — Telehealth: Payer: Self-pay

## 2020-06-17 ENCOUNTER — Ambulatory Visit (LOCAL_COMMUNITY_HEALTH_CENTER): Payer: Self-pay

## 2020-06-17 ENCOUNTER — Other Ambulatory Visit: Payer: Self-pay

## 2020-06-17 ENCOUNTER — Other Ambulatory Visit: Payer: Medicaid Other

## 2020-06-17 VITALS — Wt 203.5 lb

## 2020-06-17 DIAGNOSIS — R7611 Nonspecific reaction to tuberculin skin test without active tuberculosis: Secondary | ICD-10-CM | POA: Insufficient documentation

## 2020-06-17 LAB — TB SKIN TEST
Induration: 11 mm
TB Skin Test: POSITIVE

## 2020-06-17 NOTE — Telephone Encounter (Signed)
[facility] as scheduled for PPDR. Call to client with reminder PPD due to be read today. Per client, she overslept and is now getting ready to come to the [facility]. Jossie Ng, RN

## 2020-06-17 NOTE — Progress Notes (Signed)
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 Denies prior hx of positive PPD.  Pt is healthcare worker in long term facilities.Epi flowsheet completed. Sent to lab for HIV, Syphilis, CBC, Hepatic fxn. Referred to [facility] for chest xray and paper order given to pt. Says she will try to go today or tomorrow. Jerel Shepherd, RN

## 2020-06-18 LAB — CBC WITH DIFFERENTIAL/PLATELET
Basophils Absolute: 0 10*3/uL (ref 0.0–0.2)
Basos: 1 %
EOS (ABSOLUTE): 0.1 10*3/uL (ref 0.0–0.4)
Eos: 2 %
Hematocrit: 37.2 % (ref 34.0–46.6)
Hemoglobin: 13 g/dL (ref 11.1–15.9)
Immature Grans (Abs): 0 10*3/uL (ref 0.0–0.1)
Immature Granulocytes: 0 %
Lymphocytes Absolute: 2.7 10*3/uL (ref 0.7–3.1)
Lymphs: 41 %
MCH: 31.2 pg (ref 26.6–33.0)
MCHC: 34.9 g/dL (ref 31.5–35.7)
MCV: 89 fL (ref 79–97)
Monocytes Absolute: 0.6 10*3/uL (ref 0.1–0.9)
Monocytes: 8 %
Neutrophils Absolute: 3.2 10*3/uL (ref 1.4–7.0)
Neutrophils: 48 %
Platelets: 327 10*3/uL (ref 150–450)
RBC: 4.17 x10E6/uL (ref 3.77–5.28)
RDW: 11.6 % — ABNORMAL LOW (ref 11.7–15.4)
WBC: 6.7 10*3/uL (ref 3.4–10.8)

## 2020-06-18 LAB — HEPATIC FUNCTION PANEL
ALT: 14 IU/L (ref 0–32)
AST: 15 IU/L (ref 0–40)
Albumin: 4.4 g/dL (ref 3.9–5.0)
Alkaline Phosphatase: 82 IU/L (ref 48–121)
Bilirubin Total: 0.3 mg/dL (ref 0.0–1.2)
Bilirubin, Direct: 0.09 mg/dL (ref 0.00–0.40)
Total Protein: 7 g/dL (ref 6.0–8.5)

## 2020-06-21 ENCOUNTER — Telehealth: Payer: Self-pay

## 2020-06-21 DIAGNOSIS — R7611 Nonspecific reaction to tuberculin skin test without active tuberculosis: Secondary | ICD-10-CM | POA: Insufficient documentation

## 2020-06-21 DIAGNOSIS — Z227 Latent tuberculosis: Secondary | ICD-10-CM | POA: Insufficient documentation

## 2020-06-21 NOTE — Telephone Encounter (Signed)
TC with patient re: CXR results.  Discussed LTBI vs Active TB, LTBI tx and employer letter.  Patient is still unsure if she wants to do LTBI tx. TB RN will leave employer letter, Rifampin info and LTBI vs Active TB info at info booth for pickup.  Patient has TB RN contact info and will call back if further questions or decides to proceed with LTBI tx. Richmond Campbell, RN

## 2020-06-22 ENCOUNTER — Other Ambulatory Visit: Payer: Self-pay

## 2020-06-22 ENCOUNTER — Ambulatory Visit (LOCAL_COMMUNITY_HEALTH_CENTER): Payer: Medicaid Other | Admitting: Advanced Practice Midwife

## 2020-06-22 DIAGNOSIS — Z3009 Encounter for other general counseling and advice on contraception: Secondary | ICD-10-CM

## 2020-06-22 DIAGNOSIS — Z719 Counseling, unspecified: Secondary | ICD-10-CM

## 2020-06-22 NOTE — Progress Notes (Signed)
Chart review indicates client had [facility] physical 03/2020, family planning clinic forms signed that day and Depo consent current. Client presents to clinic today stating she is here for Depo. Counseled client has only been 9 weeks and 6 days since last Depo and unable to give today. Client loudly verbalizing her displeasure at not being able to receive Depo while utilizing her cell phone and states has traveled from [place] and trip has been a waste of her time. Client questioning why she was scheduled appt for Depo today if couldn't get it. Counseled that clerical staff did not verify when last Depo given - they schedule appt type requested by client.. She then states when here for TB skin test reading on Friday (06/17/20) she asked for a card with date her Depo was due on and this is day on appt card (Per appt desk, Depo appt made 06/13/20). Consulted with E. Sciora CNM regarding early administration of Depo and client needs Depo at minimum of 10 weeks after last Depo (which was given 04/14/2020). Client aware and continued to express her displeasure at a wasted trip. Client left clinic prior to RN being able to provide her with an appt reminder card. Jossie Ng, RN

## 2020-07-22 ENCOUNTER — Ambulatory Visit: Payer: Medicaid Other

## 2020-07-28 ENCOUNTER — Ambulatory Visit: Payer: Medicaid Other

## 2020-08-18 ENCOUNTER — Ambulatory Visit (LOCAL_COMMUNITY_HEALTH_CENTER): Payer: Medicaid Other | Admitting: Physician Assistant

## 2020-08-18 ENCOUNTER — Other Ambulatory Visit: Payer: Self-pay

## 2020-08-18 ENCOUNTER — Encounter: Payer: Self-pay | Admitting: Physician Assistant

## 2020-08-18 VITALS — BP 97/64 | Ht 65.0 in | Wt 204.4 lb

## 2020-08-18 DIAGNOSIS — Z30013 Encounter for initial prescription of injectable contraceptive: Secondary | ICD-10-CM | POA: Diagnosis not present

## 2020-08-18 DIAGNOSIS — N760 Acute vaginitis: Secondary | ICD-10-CM

## 2020-08-18 DIAGNOSIS — Z3009 Encounter for other general counseling and advice on contraception: Secondary | ICD-10-CM | POA: Diagnosis not present

## 2020-08-18 DIAGNOSIS — Z113 Encounter for screening for infections with a predominantly sexual mode of transmission: Secondary | ICD-10-CM

## 2020-08-18 DIAGNOSIS — B9689 Other specified bacterial agents as the cause of diseases classified elsewhere: Secondary | ICD-10-CM

## 2020-08-18 LAB — WET PREP FOR TRICH, YEAST, CLUE
Trichomonas Exam: NEGATIVE
Yeast Exam: NEGATIVE

## 2020-08-18 MED ORDER — METRONIDAZOLE 500 MG PO TABS: 500.0000 mg | ORAL_TABLET | Freq: Two times a day (BID) | ORAL | 0 refills | Status: AC

## 2020-08-18 NOTE — Progress Notes (Addendum)
Patient here for Depo at 18 weeks since last Depo. Last PE and Depo order 04/14/2020. Last Pap 09/22/2018, neg. States she is interested in STD testing while here .Burt Knack, RN

## 2020-08-18 NOTE — Progress Notes (Signed)
Patient given Depo, per C. [place], [place]. Patient given next Depo reminder card. Wet mount reviewed, patient treated for BV per SO.Marland KitchenBurt Knack, RN

## 2020-08-19 NOTE — Progress Notes (Signed)
[facility] problem [facility]  [facility]  Subjective:  Linda Reyes is a 29 y.o. being seen today for Depo.  Chief Complaint  Patient presents with  . Contraception    Depo    HPI  Patient into clinic today to restart Depo and requests screening for STDs except blood work.  States that she has noticed an increase in vaginal discharge and thinks she may have yeast or BV.  Denies other symptoms.   Does the patient have a current or past history of drug use? No   No components found for: HCV]   Health Maintenance Due  Topic Date Due  . Hepatitis C Screening  Never done  . COVID-19 Vaccine (1) Never done  . TETANUS/TDAP  Never done  . INFLUENZA VACCINE  Never done    Review of Systems  All other systems reviewed and are negative.   The following portions of the patient's history were reviewed and updated as appropriate: allergies, current medications, past family history, past medical history, past social history, past surgical history and problem list. Problem list updated.   See flowsheet for other program required questions.  Objective:   Vitals:   08/18/20 1320  BP: 97/64  Weight: 204 lb 6.4 oz (92.7 kg)  Height: 5\' 5"  (1.651 m)    Physical Exam Vitals and nursing note reviewed.  Constitutional:      Appearance: Normal appearance.  HENT:     Head: Normocephalic.     Mouth/Throat:     Mouth: Mucous membranes are moist.     Pharynx: Oropharynx is clear. No oropharyngeal exudate or posterior oropharyngeal erythema.  Eyes:     Conjunctiva/sclera: Conjunctivae normal.  Pulmonary:     Effort: Pulmonary effort is normal.  Abdominal:     Palpations: Abdomen is soft. There is no mass.     Tenderness: There is no abdominal tenderness. There is no guarding or rebound.  Genitourinary:    General: Normal vulva.     Rectum: Normal.     Comments: External genitalia/pubic area without nits, lice, edema, erythema, lesions and  inguinal adenopathy. Vagina with normal mucosa and moderate amount of thin, white discharge, pH=>4.5. Cervix without visible lesions. Uterus firm, mobile, nt, no CMT, no masses, no adnexal tenderness, fullness or masses. Musculoskeletal:     Cervical back: Neck supple. No tenderness.  Lymphadenopathy:     Cervical: No cervical adenopathy.  Skin:    General: Skin is warm and dry.     Findings: No bruising, erythema, lesion or rash.  Neurological:     Mental Status: She is alert and oriented to person, place, and time.  Psychiatric:        Mood and Affect: Mood normal.        Behavior: Behavior normal.        Thought Content: Thought content normal.        Judgment: Judgment normal.       Assessment and Plan:  Linda Reyes is a 29 y.o. female presenting to the [facility] Department for a Women's Health problem visit  1. Encounter for counseling regarding contraception Reviewed with patient SE of Depo and that she should use condoms as back up for 2 weeks after shot today. Enc condoms with all sex.  2. Screening for STD (sexually transmitted disease) Await test results.  Counseled that RN will call if needs to RTC for treatment once results are back.  - WET PREP FOR TRICH, YEAST,  CLUE - Chlamydia/Gonorrhea [facility]  3. BV (bacterial vaginosis) Treat BV with Metronidazole 500 mg #14 1 po BID for 7 days with food, no EtOH for 24 hr before and until 72 hr after completing medicine. No sex for 7 days. Enc to use OTC antifungal cream if has itching during or just after treatment with antibiotics. - metroNIDAZOLE (FLAGYL) 500 MG tablet; Take 1 tablet (500 mg total) by mouth 2 (two) times daily for 7 days.  Dispense: 14 tablet; Refill: 0     Return in about 11 weeks (around 11/03/2020) for Depo.  No future appointments.  Matt Holmes, PA

## 2020-09-17 ENCOUNTER — Emergency Department
Admission: EM | Admit: 2020-09-17 | Discharge: 2020-09-17 | Payer: Medicaid Other | Attending: Emergency Medicine | Admitting: Emergency Medicine

## 2020-09-17 ENCOUNTER — Other Ambulatory Visit: Payer: Self-pay

## 2020-09-17 DIAGNOSIS — Z0289 Encounter for other administrative examinations: Secondary | ICD-10-CM | POA: Insufficient documentation

## 2020-09-17 DIAGNOSIS — F17210 Nicotine dependence, cigarettes, uncomplicated: Secondary | ICD-10-CM | POA: Insufficient documentation

## 2020-09-17 DIAGNOSIS — Z008 Encounter for other general examination: Secondary | ICD-10-CM

## 2020-09-17 NOTE — ED Provider Notes (Signed)
[facility] Emergency Department Provider Note ____________________________________________   None    (approximate)  I have reviewed the triage vital signs and the nursing notes.  HISTORY  Chief Complaint Medical Clearance   HPI Linda Reyes is a 29 y.o. femalewho presents to the ED for evaluation of medical clearance.  Chart review indicates no relevant recent medical history.  Patient presents to the ED in law enforcement custody for blood draw prior to incarceration.  She denies any complaints.  Denies recent illnesses.  Denies pain anywhere, assault, chest pain, syncope, shortness of breath, abdominal pain, vomiting or diarrhea.  Denies fevers.    Past Medical History:  Diagnosis Date  . Allergy   . Gestational diabetes     Patient Active Problem List   Diagnosis Date Noted  . 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 Edema in pregnancy, delivered 01/08/2018  . Second degree perineal laceration during delivery 01/08/2018  . Hemorrhoids 12/31/2017    Past Surgical History:  Procedure Laterality Date  . WISDOM TOOTH EXTRACTION  2017    Prior to Admission medications   Medication Sig Start Date End Date Taking? Authorizing Provider  acetaminophen (TYLENOL) 500 MG tablet Take 1,000 mg by mouth every 6 (six) hours as needed for moderate pain. Patient not taking: Reported on 06/17/2020    [provider]  hydrocortisone (ANUSOL-HC) 2.5 % rectal cream Place 1 application rectally 2 (two) times daily. Patient not taking: Reported on 02/18/2018 01/08/18   Hermina Staggers, MD  ibuprofen (ADVIL,MOTRIN) 800 MG tablet Take 1 tablet (800 mg total) by mouth every 8 (eight) hours as needed. Patient not taking: Reported on 04/14/2020 01/08/18   Hermina Staggers, MD  Lidocaine, Anorectal, 5 % GEL Apply 1 application topically as needed. Patient not taking: Reported on 02/18/2018 12/31/17   Constant, Peggy, MD  Multiple  Vitamins-Minerals (MULTIVITAMIN WITH MINERALS) tablet Take 1 tablet by mouth daily. Patient not taking: Reported on 04/14/2020 09/02/19   Larene Pickett, FNP  oxyCODONE-acetaminophen (PERCOCET/ROXICET) 5-325 MG tablet Take 1 tablet by mouth every 6 (six) hours as needed for severe pain. Patient not taking: Reported on 02/18/2018 01/08/18   Hermina Staggers, MD  Prenatal MV-Min-FA-Omega-3 (PRENATAL GUMMIES/DHA & FA) 0.4-32.5 MG CHEW Chew 1 tablet by mouth daily. Patient not taking: Reported on 09/02/2019 02/18/18   Adam Phenix, MD  Prenatal Vit w/Fe-Methylfol-FA (PNV PO) Take 1 tablet by mouth daily.  Patient not taking: Reported on 06/17/2020    [provider]  traMADol-acetaminophen (ULTRACET) 37.5-325 MG tablet Take 1-2 tablets by mouth every 6 (six) hours as needed for severe pain. Patient not taking: Reported on 09/02/2019 02/18/18   Adam Phenix, MD  triamterene-hydrochlorothiazide (MAXZIDE-25) 37.5-25 MG tablet Take 1 tablet by mouth daily. Patient not taking: Reported on 02/18/2018 01/08/18   Hermina Staggers, MD    Allergies Patient has no known allergies.  Family History  Problem Relation Age of Onset  . Prostate cancer Maternal Grandfather     Social History Social History   Tobacco Use  . Smoking status: Current Every Day Smoker    Types: Cigarettes  . Smokeless tobacco: Never Used  . Tobacco comment: smokes 3 cigarettes/day  Vaping Use  . Vaping Use: Never used  Substance Use Topics  . Alcohol use: Yes    Alcohol/week: 6.0 standard drinks    Types: 6 Shots of liquor per week    Comment: 3 shots tequila twice weekly  . Drug  use: No    Review of Systems  Constitutional: No fever/chills Eyes: No visual changes. ENT: No sore throat. Cardiovascular: Denies chest pain. Respiratory: Denies shortness of breath. Gastrointestinal: No abdominal pain.  No nausea, no vomiting.  No diarrhea.  No constipation. Genitourinary: Negative for dysuria. Musculoskeletal:  Negative for back pain. Skin: Negative for rash. Neurological: Negative for headaches, focal weakness or numbness.  ____________________________________________   PHYSICAL EXAM:  VITAL SIGNS: There were no vitals filed for this visit.    Constitutional: Alert and oriented. Well appearing and in no acute distress. Eyes: Conjunctivae are normal. PERRL. EOMI. Head: Atraumatic. Nose: No congestion/rhinnorhea. Mouth/Throat: Mucous membranes are moist.  Oropharynx non-erythematous. Neck: No stridor. No cervical spine tenderness to palpation. Cardiovascular: Normal rate, regular rhythm. Grossly normal heart sounds.  Good peripheral circulation. Respiratory: Normal respiratory effort.  No retractions. Lungs CTAB. Gastrointestinal: Soft , nondistended, nontender to palpation. No abdominal bruits. No CVA tenderness. Musculoskeletal: No lower extremity tenderness nor edema.  No joint effusions. No signs of acute trauma. Neurologic:  Normal speech and language. No gross focal neurologic deficits are appreciated. No gait instability noted. Skin:  Skin is warm, dry and intact. No rash noted. Psychiatric: Mood and affect are normal. Speech and behavior are normal.  ____________________________________________   MDM / ED COURSE  30 year old female presents to the ED under law enforcement custody for legal blood draw.  Patient is clinically sober, answering questions appropriately and in no distress.  She has no evidence of trauma, neurovascular deficits or distress.  Blood samples obtained by law enforcement.  Patient medically cleared for incarceration.  ____________________________________________   FINAL CLINICAL IMPRESSION(S) / ED DIAGNOSES  Final diagnoses:  Medical clearance for incarceration     ED Discharge Orders    None       Tashauna Caisse   Note:  This document was prepared using Dragon voice recognition software and may include unintentional dictation errors.   Delton Prairie, MD 09/17/20 [phone]

## 2020-09-17 NOTE — ED Triage Notes (Signed)
Patient to ED in custody of [place] [employer] for forensic blood draw and medical clearance for jail.
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
## 2020-09-17 NOTE — Discharge Instructions (Addendum)
This patient is medically cleared for incarceration.  Return to the ED with any acute medical concerns.

## 2020-09-17 NOTE — ED Notes (Signed)
Blood obtained from left antecub after area cleansed with betadine.  Patient tolerated will.  Specimen x 2 given to [employer] [place] [employer].

## 2020-11-01 ENCOUNTER — Ambulatory Visit: Payer: Medicaid Other

## 2020-11-03 ENCOUNTER — Other Ambulatory Visit: Payer: Self-pay

## 2020-11-03 ENCOUNTER — Ambulatory Visit (LOCAL_COMMUNITY_HEALTH_CENTER): Payer: Medicaid Other | Admitting: Physician Assistant

## 2020-11-03 VITALS — BP 101/68 | Wt 199.2 lb

## 2020-11-03 DIAGNOSIS — Z113 Encounter for screening for infections with a predominantly sexual mode of transmission: Secondary | ICD-10-CM

## 2020-11-03 DIAGNOSIS — Z30013 Encounter for initial prescription of injectable contraceptive: Secondary | ICD-10-CM

## 2020-11-03 DIAGNOSIS — Z3009 Encounter for other general counseling and advice on contraception: Secondary | ICD-10-CM | POA: Diagnosis not present

## 2020-11-03 DIAGNOSIS — Z3042 Encounter for surveillance of injectable contraceptive: Secondary | ICD-10-CM

## 2020-11-03 LAB — WET PREP FOR TRICH, YEAST, CLUE
Trichomonas Exam: NEGATIVE
Yeast Exam: NEGATIVE

## 2020-11-03 NOTE — Progress Notes (Signed)
Patient here for depo, last depo 13 weeks ago. Patient wants STD screening, declined blood work.   Harvie Heck, RN  Post:  Mellody Drown mount reviewed with patient, no tx per S.O. RN admin Depo 150mg  IM per C. [place] PA order. Provider orders complete.

## 2020-11-04 NOTE — Progress Notes (Signed)
[facility] problem visit  [facility]  Subjective:  Linda Reyes is a 29 y.o. being seen today for Depo and requests STD screening.  Chief Complaint  Patient presents with  . Contraception    depo and std screening    HPI  Patient into clinic for Depo today.  Reports that she has had vaginal itching for 2 days both internally and externally.  Requests STD screening to evaluate for infection.  Declines blood work today.    Does the patient have a current or past history of drug use? No   No components found for: HCV]   Health Maintenance Due  Topic Date Due  . Hepatitis C Screening  Never done  . COVID-19 Vaccine (1) Never done  . TETANUS/TDAP  Never done  . INFLUENZA VACCINE  Never done    Review of Systems  All other systems reviewed and are negative.   The following portions of the patient's history were reviewed and updated as appropriate: allergies, current medications, past family history, past medical history, past social history, past surgical history and problem list. Problem list updated.   See flowsheet for other program required questions.  Objective:   Vitals:   11/03/20 1112  BP: 101/68  Weight: 199 lb 3.2 oz (90.4 kg)    Physical Exam Vitals and nursing note reviewed.  Constitutional:      General: She is not in acute distress.    Appearance: Normal appearance.  HENT:     Head: Normocephalic and atraumatic.     Mouth/Throat:     Mouth: Mucous membranes are moist.     Pharynx: Oropharynx is clear. No oropharyngeal exudate or posterior oropharyngeal erythema.  Eyes:     Conjunctiva/sclera: Conjunctivae normal.  Pulmonary:     Effort: Pulmonary effort is normal.  Abdominal:     Palpations: Abdomen is soft. There is no mass.     Tenderness: There is no abdominal tenderness. There is no guarding or rebound.  Genitourinary:    General: Normal vulva.     Rectum: Normal.     Comments: External  genitalia/pubic area without nits, lice, edema, erythema, lesions and inguinal adenopathy. Vagina with normal mucosa and discharge. Cervix without visible lesions. Uterus firm, mobile, nt, no masses, no CMT, no adnexal tenderness or fullness. Musculoskeletal:     Cervical back: Neck supple. No tenderness.  Skin:    General: Skin is warm and dry.     Findings: No bruising, erythema, lesion or rash.  Neurological:     Mental Status: She is alert and oriented to person, place, and time.  Psychiatric:        Mood and Affect: Mood normal.        Behavior: Behavior normal.        Thought Content: Thought content normal.        Judgment: Judgment normal.       Assessment and Plan:  Linda Reyes is a 29 y.o. female presenting to the [facility] Department for a Women's Health problem visit  1. Encounter for counseling regarding contraception Reviewed with patient SE of Depo and when to call clinic for irregular bleeding. Enc condoms with all sex.  2. Screening for STD (sexually transmitted disease) Await test results.  Counseled that RN will call if needs to RTC for treatment once results are back. Counseled that patient can use OTC antifungal cream for symptom relief while awaiting results.  - WET PREP FOR TRICH, YEAST,  CLUE - Chlamydia/Gonorrhea [facility]  3. Surveillance for Depo-Provera contraception OK to continue with Depo per 03/2020 order.     Return in about 11 weeks (around 01/19/2021) for next depo due date.  No future appointments.  Matt Holmes, PA

## 2021-03-02 ENCOUNTER — Ambulatory Visit (LOCAL_COMMUNITY_HEALTH_CENTER): Payer: Medicaid Other | Admitting: Family Medicine

## 2021-03-02 ENCOUNTER — Encounter: Payer: Self-pay | Admitting: Physician Assistant

## 2021-03-02 ENCOUNTER — Other Ambulatory Visit: Payer: Self-pay

## 2021-03-02 VITALS — BP 112/73 | Ht 66.0 in | Wt 209.2 lb

## 2021-03-02 DIAGNOSIS — Z3009 Encounter for other general counseling and advice on contraception: Secondary | ICD-10-CM

## 2021-03-02 DIAGNOSIS — Z3042 Encounter for surveillance of injectable contraceptive: Secondary | ICD-10-CM

## 2021-03-02 NOTE — Progress Notes (Signed)
Patient here for depo. 17 weeks since last depo. Declines testing today.   Harvie Heck, RN

## 2021-03-02 NOTE — Progress Notes (Signed)
Depo given, left deltoid, tolerated well, next Depo card given.Burt Knack, RN

## 2021-03-02 NOTE — Progress Notes (Signed)
   WH problem visit  [facility]  Subjective:  Linda Reyes is a 30 y.o. being seen today for   Chief Complaint  Patient presents with  . Contraception    Depo-late     HPI   Does the patient have a current or past history of drug use? No   No components found for: HCV]   Health Maintenance Due  Topic Date Due  . Hepatitis C Screening  Never done  . COVID-19 Vaccine (1) Never done  . TETANUS/TDAP  Never done  . INFLUENZA VACCINE  Never done    Review of Systems  Constitutional: Negative.     The following portions of the patient's history were reviewed and updated as appropriate: allergies, current medications, past family history, past medical history, past social history, past surgical history and problem list. Problem list updated.   See flowsheet for other program required questions.  Objective:   Vitals:   03/02/21 1423  BP: 112/73  Weight: 209 lb 3.2 oz (94.9 kg)  Height: 5\' 6"  (1.676 m)    Physical Exam Constitutional:      Appearance: Normal appearance.  Pulmonary:     Effort: Pulmonary effort is normal.  Skin:    General: Skin is warm and dry.  Neurological:     Mental Status: She is alert and oriented to person, place, and time.  Psychiatric:        Mood and Affect: Mood normal.        Behavior: Behavior normal.        Assessment and Plan:  Linda Reyes is a 30 y.o. female presenting to the [facility] Department for a Women's Health problem visit  There are no diagnoses linked to this encounter.  1. Family planning counseling 2. Encounter for surveillance of injectable contraceptive  Patient her today for late depo.  Patient last depo was 11/04/2019.  Reports Last sex  02/02/21 without condom.   Reports no problems or concerns.  Declines need for STI screening today.   Discussed importance of  Keeping depo 11-13 weeks for most benefits.  Discussed taking a calcium and  Vitamin D supplement.    RN: Depo ok to give depo today per 04/14/2020 order .   Patient informed of need for physical and new depo order  After 04/15/2021 date.   Patient wants depo in arm today .     Return in about 3 months (around 06/02/2021) for depo, annual well woman exam.  No future appointments.  06/04/2021, FNP

## 2021-06-08 ENCOUNTER — Ambulatory Visit: Payer: Medicaid Other

## 2021-06-15 ENCOUNTER — Ambulatory Visit: Payer: Medicaid Other

## 2021-06-22 ENCOUNTER — Ambulatory Visit: Payer: Medicaid Other

## 2021-07-20 ENCOUNTER — Telehealth: Payer: Self-pay | Admitting: Family Medicine

## 2021-07-20 ENCOUNTER — Ambulatory Visit (LOCAL_COMMUNITY_HEALTH_CENTER): Payer: Self-pay

## 2021-07-20 ENCOUNTER — Other Ambulatory Visit: Payer: Self-pay

## 2021-07-20 DIAGNOSIS — R7611 Nonspecific reaction to tuberculin skin test without active tuberculosis: Secondary | ICD-10-CM

## 2021-07-20 NOTE — Telephone Encounter (Signed)
Please call me in ref to my TB Screening paperwork. I need this info ASAP!

## 2021-07-20 NOTE — Progress Notes (Signed)
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 Pt had + ppd (11 mm) on 06/17/2020, chest xray with no active TB on 06/17/2020. Pt did  not take LTBI meds. Denies any TB signs/symptoms. TB screen completed and given to pt, copy sent for scanning. RN walked pt to clerk to pay TB screen charge [phone]). Jerel Shepherd, RN                             N

## 2021-07-20 NOTE — Telephone Encounter (Signed)
Phone call to pt who reports she needs a TB Screen ASAP for work. Says she had +ppd last year. RN explained that TB screening would need to be done and that she should never have a ppd placed because it will always be positive d/t her hx of +ppd. Charge of $21 for TB screen explained. Pt to be worked into schedule this afternoon at 3:30pm. Pt in agreement. Jerel Shepherd, RN

## 2021-07-25 ENCOUNTER — Other Ambulatory Visit: Payer: Self-pay

## 2021-07-25 ENCOUNTER — Ambulatory Visit (LOCAL_COMMUNITY_HEALTH_CENTER): Payer: Medicaid Other | Admitting: Advanced Practice Midwife

## 2021-07-25 ENCOUNTER — Encounter: Payer: Self-pay | Admitting: Advanced Practice Midwife

## 2021-07-25 VITALS — BP 108/70 | Ht 66.0 in | Wt 210.2 lb

## 2021-07-25 DIAGNOSIS — E669 Obesity, unspecified: Secondary | ICD-10-CM | POA: Insufficient documentation

## 2021-07-25 DIAGNOSIS — Z3202 Encounter for pregnancy test, result negative: Secondary | ICD-10-CM | POA: Diagnosis not present

## 2021-07-25 DIAGNOSIS — A599 Trichomoniasis, unspecified: Secondary | ICD-10-CM

## 2021-07-25 DIAGNOSIS — Z3009 Encounter for other general counseling and advice on contraception: Secondary | ICD-10-CM | POA: Diagnosis not present

## 2021-07-25 DIAGNOSIS — Z3042 Encounter for surveillance of injectable contraceptive: Secondary | ICD-10-CM | POA: Diagnosis not present

## 2021-07-25 LAB — WET PREP FOR TRICH, YEAST, CLUE
Trichomonas Exam: POSITIVE — AB
Yeast Exam: NEGATIVE

## 2021-07-25 LAB — PREGNANCY, URINE: Preg Test, Ur: NEGATIVE

## 2021-07-25 MED ORDER — METRONIDAZOLE 500 MG PO TABS: 500.0000 mg | ORAL_TABLET | Freq: Two times a day (BID) | ORAL | 0 refills | Status: AC

## 2021-07-25 MED ORDER — MEDROXYPROGESTERONE ACETATE 150 MG/ML IM SUSP
150.0000 mg | INTRAMUSCULAR | Status: AC
  Administered 2022-02-22: 18:00:00 150 mg via INTRAMUSCULAR

## 2021-07-25 NOTE — Progress Notes (Signed)
Pt here for PE and Depo.  Wet mount results reviewed, medication dispensed per SO.  Depo 150 mg given IM in Lt Deltoid.  Pt given reminder cared to return in 11-13 weeks for next Depo injection. Berdie Ogren, RN

## 2021-07-25 NOTE — Progress Notes (Signed)
[facility] DEPARTMENT [facility] [address] Main Number: [phone]    Family Planning Visit- Initial Visit  Subjective:  Linda Reyes is a 30 y.o. SBF nonsnmoker G1P1001 (3 yo daughter)  being seen today for an initial annual visit and to discuss contraceptive options.  The patient is currently using None for pregnancy prevention. Patient reports she does not want a pregnancy in the next year.  Patient has the following medical conditions has Hemorrhoids; Second degree perineal laceration during delivery; PPD positive; Latent tuberculosis; and Obesity BMI=33.9 on their problem list.  Chief Complaint  Patient presents with   Annual Exam   Sexual Problem    PE and Depo    Patient reports here for physical and DMPA. LMP ?Marland Kitchen Last sex 06/28/21 with condom; with current partner x 1 year; 2 sex partners in last 3 mo. Last pap 09/22/18 neg. Last ETOH 07/23/21 (3 mixed drinks) q weekend. Doesn't want pregnancy. Working 15-25 hrs/wk and not in school. Living with her 71 yo daughter.  Patient denies cigs, vaping, cigars, MJ  Body mass index is 33.93 kg/m. - Patient is eligible for diabetes screening based on BMI and age >62?  not applicable HA1C ordered? not applicable  Patient reports 2  partner/s in last year. Desires STI screening?  No - declines  bloodwork  Has patient been screened once for HCV in the past?  No  No results found for: HCVAB  Does the patient have current drug use (including MJ), have a partner with drug use, and/or has been incarcerated since last result? No  If yes-- Screen for HCV through [facility]   Does the patient meet criteria for HBV testing? No  Criteria:  -Household, sexual or needle sharing contact with HBV -History of drug use -HIV positive -Those with known Hep C   Health Maintenance Due  Topic Date Due   COVID-19 Vaccine (1) Never done   Pneumococcal Vaccine 42-13 Years old (1 - PCV) Never done    Hepatitis C Screening  Never done   TETANUS/TDAP  Never done   INFLUENZA VACCINE  07/17/2021   PAP SMEAR-Modifier  09/22/2021    Review of Systems  All other systems reviewed and are negative.  The following portions of the patient's history were reviewed and updated as appropriate: allergies, current medications, past family history, past medical history, past social history, past surgical history and problem list. Problem list updated.   See flowsheet for other program required questions.  Objective:   Vitals:   07/25/21 1316  BP: 108/70  Weight: 210 lb 3.2 oz (95.3 kg)  Height: 5\' 6"  (1.676 m)    Physical Exam Constitutional:      Appearance: Normal appearance. She is obese.  HENT:     Head: Normocephalic and atraumatic.     Mouth/Throat:     Mouth: Mucous membranes are moist.     Comments: Last dental exam 6 mo ago Eyes:     Conjunctiva/sclera: Conjunctivae normal.  Neck:     Thyroid: No thyroid mass, thyromegaly or thyroid tenderness.  Cardiovascular:     Rate and Rhythm: Normal rate and regular rhythm.  Pulmonary:     Effort: Pulmonary effort is normal.     Breath sounds: Normal breath sounds.  Chest:  Breasts:    Right: Normal.     Left: Normal.  Abdominal:     Palpations: Abdomen is soft.     Comments: Poor tone, soft without masses or  tenderness, increased adipose  Genitourinary:    General: Normal vulva.     Exam position: Lithotomy position.     Vagina: Vaginal discharge (large amt frothy white creamy leukorrhea, ph<4.5) present.     Cervix: Normal.     Uterus: Normal.      Adnexa: Right adnexa normal and left adnexa normal.     Rectum: Normal.     Comments: Pap not done--not due  Musculoskeletal:        General: Normal range of motion.     Cervical back: Normal range of motion and neck supple.  Lymphadenopathy:     Cervical:     Right cervical: No superficial, deep or posterior cervical adenopathy.    Left cervical: No superficial, deep or  posterior cervical adenopathy.  Skin:    General: Skin is warm and dry.  Neurological:     Mental Status: She is alert.  Psychiatric:        Mood and Affect: Mood normal.      Assessment and Plan:  Linda Reyes is a 30 y.o. female presenting to the [facility] Department for an initial annual wellness/contraceptive visit  Contraception counseling: Reviewed all forms of birth control options in the tiered based approach. available including abstinence; over the counter/barrier methods; hormonal contraceptive medication including pill, patch, ring, injection,contraceptive implant, ECP; hormonal and nonhormonal IUDs; permanent sterilization options including vasectomy and the various tubal sterilization modalities. Risks, benefits, and typical effectiveness rates were reviewed.  Questions were answered.  Written information was also given to the patient to review.  Patient desires DMPA, this was prescribed for patient. She will follow up in  11-13 wks for surveillance.  She was told to call with any further questions, or with any concerns about this method of contraception.  Emphasized use of condoms 100% of the time for STI prevention.  Patient was offered ECP. ECP was not accepted by the patient. ECP counseling was not given - see RN documentation  1. Family planning Please give primary care MD list to pt Treat wet mount per standing orders Immunization nurse consult - WET PREP FOR TRICH, YEAST, CLUE - Pregnancy, urine - Chlamydia/Gonorrhea [facility]  2. Encounter for surveillance of injectable contraceptive If PT neg today may have DMPA 150 mg IM q 11-13 wks x 1 year Please counsel on need for abstinance/back up condoms next 7 days  3. Obesity, unspecified classification, unspecified obesity type, unspecified whether serious comorbidity present      No follow-ups on file.  No future appointments.  Alberteen Spindle, CNM

## 2021-11-16 ENCOUNTER — Telehealth: Payer: Self-pay | Admitting: Family Medicine

## 2021-11-16 NOTE — Telephone Encounter (Signed)
Pt received her Depo injection and a reminder card,  to return in 11-13 weeks, on 07/25/2021.  Pt has an appointment on 12/8 and I have informed her that we do not have any appointments available, before that date.  Pt states she understands.

## 2021-11-16 NOTE — Telephone Encounter (Signed)
Pt. was here in August for PE & DEPO. She states that she was not given the DEPO and now she is having her period. She wants to get the injection as soon as possible, but only availability on the 13th. She would like to be work in or seeing under any type of visit to get the issue resolved. Please call the patient or the clerk if any spots available.

## 2021-11-23 ENCOUNTER — Ambulatory Visit: Payer: Medicaid Other

## 2021-11-28 ENCOUNTER — Ambulatory Visit: Payer: Medicaid Other

## 2021-11-30 ENCOUNTER — Ambulatory Visit (LOCAL_COMMUNITY_HEALTH_CENTER): Payer: Medicaid Other | Admitting: Family Medicine

## 2021-11-30 ENCOUNTER — Ambulatory Visit: Payer: Medicaid Other

## 2021-11-30 ENCOUNTER — Other Ambulatory Visit: Payer: Self-pay

## 2021-11-30 VITALS — BP 100/66 | Ht 66.0 in | Wt 210.4 lb

## 2021-11-30 DIAGNOSIS — Z3042 Encounter for surveillance of injectable contraceptive: Secondary | ICD-10-CM

## 2021-11-30 DIAGNOSIS — Z3009 Encounter for other general counseling and advice on contraception: Secondary | ICD-10-CM

## 2021-11-30 NOTE — Progress Notes (Signed)
Pt here for Depo injection.  Pt request that it be given in Lt deltoid.  Depo 150 mg given without any complications.  Pt given reminder card to return around 02/15/2022, for next Depo injection. Berdie Ogren, RN

## 2021-11-30 NOTE — Progress Notes (Signed)
Patient in clinic for depo today.  Patient reports last depo was given on 07/25/21.  Discussed with patient the importance of keeping DMPA between 11-13 weeks for most effectiveness of Depo.   Ok for RN to given Depo per  E. Scoria order on 07/25/21.   Patient give reminder card for next depo date. See RN note.   Patient to call if any questions or concerns.     Wendi Snipes, FNP

## 2022-02-19 ENCOUNTER — Ambulatory Visit: Payer: Medicaid Other

## 2022-02-22 ENCOUNTER — Other Ambulatory Visit: Payer: Self-pay

## 2022-02-22 ENCOUNTER — Ambulatory Visit (LOCAL_COMMUNITY_HEALTH_CENTER): Payer: Medicaid Other

## 2022-02-22 VITALS — BP 115/68 | Ht 66.0 in | Wt 212.0 lb

## 2022-02-22 DIAGNOSIS — Z3009 Encounter for other general counseling and advice on contraception: Secondary | ICD-10-CM | POA: Diagnosis not present

## 2022-02-22 DIAGNOSIS — Z3042 Encounter for surveillance of injectable contraceptive: Secondary | ICD-10-CM | POA: Diagnosis not present

## 2022-02-22 NOTE — Progress Notes (Signed)
!  2 wks 0 days post depo injection.  Desires to continue and denies any complications related to [facility].  Depo administered today left deltoid at pts request per order by Ola Spurr, CNM, dated 07-25-2021.  Next depo due 05-10-21. Appt reminder given to pt. And stressed importance of complying with recommended time between injections. ?Tonny Branch, RN  ?

## 2022-05-03 ENCOUNTER — Ambulatory Visit: Payer: Medicaid Other

## 2022-06-29 ENCOUNTER — Telehealth: Payer: Self-pay

## 2022-06-29 NOTE — Telephone Encounter (Signed)
[facility] called requesting a copy of patient's TB results from last year. Patient waiting in their lobby. Copy of TB PPD screening form faxed to [facility].

## 2022-11-15 ENCOUNTER — Ambulatory Visit: Payer: Medicaid Other

## 2022-11-19 ENCOUNTER — Ambulatory Visit: Payer: Medicaid Other

## 2022-11-20 ENCOUNTER — Ambulatory Visit: Payer: Medicaid Other

## 2022-12-04 ENCOUNTER — Ambulatory Visit: Payer: Medicaid Other

## 2022-12-24 ENCOUNTER — Telehealth: Payer: Self-pay | Admitting: Family Medicine

## 2022-12-24 NOTE — Telephone Encounter (Signed)
Attempted to call pt back. Left message. See encounters

## 2022-12-26 ENCOUNTER — Ambulatory Visit (LOCAL_COMMUNITY_HEALTH_CENTER): Payer: Medicaid Other | Admitting: Family Medicine

## 2022-12-26 VITALS — BP 112/78 | Ht 66.0 in | Wt 205.0 lb

## 2022-12-26 DIAGNOSIS — Z3009 Encounter for other general counseling and advice on contraception: Secondary | ICD-10-CM

## 2022-12-26 DIAGNOSIS — Z30013 Encounter for initial prescription of injectable contraceptive: Secondary | ICD-10-CM

## 2022-12-26 DIAGNOSIS — Z309 Encounter for contraceptive management, unspecified: Secondary | ICD-10-CM

## 2022-12-26 DIAGNOSIS — Z0000 Encounter for general adult medical examination without abnormal findings: Secondary | ICD-10-CM

## 2022-12-26 MED ORDER — MEDROXYPROGESTERONE ACETATE 150 MG/ML IM SUSP
150.0000 mg | INTRAMUSCULAR | Status: AC
  Administered 2022-12-26 – 2023-12-17 (×2): 150 mg via INTRAMUSCULAR

## 2022-12-26 NOTE — Telephone Encounter (Signed)
TC to Linda Reyes. She voiced her frustration with previously canceled appointments and now having to wait 3 weeks for next available appointment. She said she wants to restart her BCM. Was able to get [name] in for her physical and BCM this afternoon, she will arrive at 2:15.

## 2022-12-26 NOTE — Progress Notes (Signed)
[facility] [place] Number: [phone]  Family Planning Visit- Repeat Yearly Visit  Subjective:  Linda Reyes is a 32 y.o. G1P1001  being seen today for an annual wellness visit and to discuss contraception options.   The patient is currently using No Method - Other Reason for pregnancy prevention. Patient does not want a pregnancy in the next year.   she/her/hers report they are looking for a method that provides High efficacy at preventing pregnancy   Patient has the following medical problems: has Hemorrhoids; Second degree perineal laceration during delivery; PPD positive; Latent tuberculosis; and Obesity BMI=33.9 on their problem list.  Chief Complaint  Patient presents with   Contraception    depo    Patient reports to clinic for PE and to restart depo  Patient denies concerns about self    See flowsheet for other program required questions.   Body mass index is 33.09 kg/m. - Patient is eligible for diabetes screening based on BMI> 25 and age >35?  Declined  HA1C ordered? not applicable  Patient reports 1 of partners in last year. Desires STI screening?  No - patient in a hurry   Has patient been screened once for HCV in the past?  No  No results found for: "HCVAB"  Does the patient have current of drug use, have a partner with drug use, and/or has been incarcerated since last result? No  If yes-- Screen for HCV through [facility]   Does the patient meet criteria for HBV testing? No  Criteria:  -Household, sexual or needle sharing contact with HBV -History of drug use -HIV positive -Those with known Hep C   Health Maintenance Due  Topic Date Due   COVID-19 Vaccine (1) Never done   Hepatitis C Screening  Never done   DTaP/Tdap/Td (2 - Td or Tdap) 02/03/2019   PAP SMEAR-Modifier  09/22/2021   INFLUENZA VACCINE  Never done    Review of Systems  Constitutional:  Negative for  weight loss.  Eyes:  Negative for blurred vision.  Respiratory:  Negative for cough and shortness of breath.   Cardiovascular:  Negative for claudication.  Gastrointestinal:  Negative for nausea.  Genitourinary:  Negative for dysuria and frequency.  Skin:  Negative for rash.  Neurological:  Negative for headaches.  Endo/Heme/Allergies:  Does not bruise/bleed easily.    The following portions of the patient's history were reviewed and updated as appropriate: allergies, current medications, past family history, past medical history, past social history, past surgical history and problem list. Problem list updated.  Objective:   Vitals:   12/26/22 1520  BP: 112/78  Weight: 205 lb (93 kg)    Physical Exam Constitutional:      Appearance: She is obese.  HENT:     Head: Normocephalic and atraumatic.  Cardiovascular:     Rate and Rhythm: Normal rate.  Pulmonary:     Effort: Pulmonary effort is normal.  Abdominal:     Palpations: Abdomen is soft.  Musculoskeletal:        General: Normal range of motion.  Skin:    General: Skin is warm and dry.  Neurological:     Mental Status: She is alert and oriented to person, place, and time.  Psychiatric:        Mood and Affect: Mood normal.    Assessment and Plan:  Linda Reyes is a 32 y.o. female G1P1001 presenting to the [facility] Department  for an yearly wellness and contraception visit s Contraception counseling: Reviewed options based on patient desire and reproductive life plan. Patient is interested in Hormonal Injection. This was provided to the patient today.   Risks, benefits, and typical effectiveness rates were reviewed.  Questions were answered.  Written information was also given to the patient to review.    The patient will follow up in  3 months for surveillance.  The patient was told to call with any further questions, or with any concerns about this method of contraception.  Emphasized use of condoms  100% of the time for STI prevention.  Patient was assessed for need for ECP. Not indicated   1. Well woman exam (no gynecological exam) Pap test due in 09/2021. Patient unable to stay today for Pap test. Declined genital exam. Declined CBE Declined STI testing  No concerns about self. States she will come back another day when she has more time for a Pap test.  2. Family planning counseling Last sex 11/16/22, last period 12/01/22.  - medroxyPROGESTERone (DEPO-PROVERA) injection 150 mg   Linda Reyes, [place]

## 2022-12-26 NOTE — Progress Notes (Signed)
Pt appointment for PE and to restart Depo. Seen by FNP Lowella Petties. Family planning packet provided and contents reviewed. Depo given as ordered.

## 2023-01-08 ENCOUNTER — Ambulatory Visit: Payer: Medicaid Other

## 2023-07-26 ENCOUNTER — Ambulatory Visit
Admission: EM | Admit: 2023-07-26 | Discharge: 2023-07-26 | Disposition: A | Discharge status: Home / Self Care | Payer: Medicaid Other | Source: Home / Self Care

## 2023-07-26 ENCOUNTER — Ambulatory Visit (INDEPENDENT_AMBULATORY_CARE_PROVIDER_SITE_OTHER): Payer: Medicaid Other

## 2023-07-26 DIAGNOSIS — M25531 Pain in right wrist: Secondary | ICD-10-CM

## 2023-07-26 DIAGNOSIS — S63501A Unspecified sprain of right wrist, initial encounter: Secondary | ICD-10-CM | POA: Diagnosis not present

## 2023-07-26 DIAGNOSIS — M79641 Pain in right hand: Secondary | ICD-10-CM

## 2023-07-26 DIAGNOSIS — S6391XA Sprain of unspecified part of right wrist and hand, initial encounter: Secondary | ICD-10-CM

## 2023-07-26 MED ORDER — IBUPROFEN 600 MG PO TABS
600.0000 mg | ORAL_TABLET | Freq: Three times a day (TID) | ORAL | 0 refills | Status: AC | PRN
Start: 2023-07-26 — End: ?

## 2023-07-26 NOTE — Discharge Instructions (Addendum)
Your x-rays were negative for fracture.  Your symptoms and exam are consistent with a sprain of your wrist and hand.  Please keep your right hand and wrist and the wrist brace while you are at work.  You may take ibuprofen 600 mg tablets every 8 hours as needed for pain and swelling.  You should elevate and ice the area as often as needed.  Please follow-up with your PCP if your symptoms do not improve.  Please go to the emergency room if you develop any worsening symptoms.  I hope you feel better soon!
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
## 2023-12-17 ENCOUNTER — Ambulatory Visit: Payer: Medicaid Other | Admitting: Nurse Practitioner

## 2023-12-17 VITALS — BP 105/65 | HR 70 | Ht 66.0 in | Wt 207.0 lb

## 2023-12-17 DIAGNOSIS — Z30013 Encounter for initial prescription of injectable contraceptive: Secondary | ICD-10-CM

## 2023-12-17 DIAGNOSIS — Z3042 Encounter for surveillance of injectable contraceptive: Secondary | ICD-10-CM

## 2023-12-17 DIAGNOSIS — Z309 Encounter for contraceptive management, unspecified: Secondary | ICD-10-CM

## 2023-12-17 LAB — PREGNANCY, URINE: Preg Test, Ur: NEGATIVE

## 2023-12-17 NOTE — Progress Notes (Signed)
 Patient is a 32 y.o. female who presents today for Depo and PE. Patient last had a PE 12/26/22 and next PE is due 12/28/23. Patient ok to get 1 time Depo today until PE completed in January.  RN visit only.  Marylu Lund L. Wilna Pennie, FNP-C

## 2023-12-17 NOTE — Progress Notes (Signed)
Pt is here for late depo, pt negative. Depo given in L deltoid and reminder card given. Pt declines provider exam.  Due for physical on 12/28/23, she will schedule today. Condoms declined. Gaspar Garbe, RN

## 2024-01-13 ENCOUNTER — Other Ambulatory Visit: Payer: Medicaid Other

## 2024-03-31 ENCOUNTER — Other Ambulatory Visit

## 2024-04-06 ENCOUNTER — Other Ambulatory Visit

## 2024-04-08 ENCOUNTER — Ambulatory Visit: Admitting: Family Medicine

## 2024-04-08 ENCOUNTER — Other Ambulatory Visit

## 2024-04-08 ENCOUNTER — Encounter: Payer: Self-pay | Admitting: Family Medicine

## 2024-04-08 VITALS — BP 104/63 | HR 64 | Wt 216.8 lb

## 2024-04-08 DIAGNOSIS — Z309 Encounter for contraceptive management, unspecified: Secondary | ICD-10-CM

## 2024-04-08 DIAGNOSIS — Z30013 Encounter for initial prescription of injectable contraceptive: Secondary | ICD-10-CM

## 2024-04-08 DIAGNOSIS — Z113 Encounter for screening for infections with a predominantly sexual mode of transmission: Secondary | ICD-10-CM

## 2024-04-08 DIAGNOSIS — Z3042 Encounter for surveillance of injectable contraceptive: Secondary | ICD-10-CM | POA: Insufficient documentation

## 2024-04-08 DIAGNOSIS — Z124 Encounter for screening for malignant neoplasm of cervix: Secondary | ICD-10-CM | POA: Insufficient documentation

## 2024-04-08 LAB — WET PREP FOR TRICH, YEAST, CLUE
Trichomonas Exam: NEGATIVE
Yeast Exam: NEGATIVE

## 2024-04-08 LAB — HM HIV SCREENING LAB: HM HIV Screening: NEGATIVE

## 2024-04-08 LAB — PREGNANCY, URINE: Preg Test, Ur: NEGATIVE

## 2024-04-08 MED ORDER — MEDROXYPROGESTERONE ACETATE 150 MG/ML IM SUSP
150.0000 mg | INTRAMUSCULAR | Status: AC
Start: 2024-04-08 — End: 2025-07-02
  Administered 2024-04-08: 150 mg via INTRAMUSCULAR

## 2024-04-08 NOTE — Progress Notes (Signed)
 [employer] [facility] DEPARTMENT [facility] [address] [place] [address] Main phone: [phone]  Family Planning Visit - Repeat Yearly Visit  Subjective:  Linda Reyes is a 33 y.o. G1P1001  being seen today for an annual wellness visit and to discuss contraception options. The patient is currently using no method - depo provera  lapsed  for pregnancy prevention. Patient does not want a pregnancy in the next year.   Patient reports they are looking for a method with the following characteristics:  High efficacy at preventing pregnancy Long term method  Patient has the following medical problems:  Patient Active Problem List   Diagnosis Date Noted   Encounter for surveillance of injectable contraceptive 04/08/2024   Cervical cancer screening 04/08/2024   Obesity BMI=33.9 07/25/2021   Latent tuberculosis 06/21/2020   Hemorrhoids 12/31/2017   Chief Complaint  Patient presents with   Annual Exam    Pt is here PE and depo injection   HPI Patient reports desiring a physical exam, re-initiation of depo provera , and STI testing. No concerns today. Liked the depo provera , no concerns. She is also due for PAP smear today, she is amenable to having this done today.   Review of Systems  Constitutional:  Negative for fever, malaise/fatigue and weight loss.  Respiratory:  Negative for shortness of breath.   Cardiovascular:  Negative for chest pain and palpitations.   See flowsheet for other program required questions.   Diabetes screening This patient is 33 y.o. with a BMI of Body mass index is 34.99 kg/m.Aaron Aas  Is patient eligible for diabetes screening (age >35 and BMI >25)?  no  Was Hgb A1c ordered? not applicable  STI screening Patient reports 1 of partners in last year.  Does this patient desire STI screening?  Yes  Hepatitis C screening Has patient been screened once for HCV in the past?  No  No results found for: "HCVAB"  Does  the patient meet criteria for HCV testing? No   Hepatitis B screening Does the patient meet criteria for HBV testing? No  Cervical Cancer Screening  Result Date Procedure Results Follow-ups  09/22/2018 HM PAP SMEAR HM Pap smear: Neg   06/18/2017 Cytology - PAP Adequacy: Satisfactory for evaluation  endocervical/transformation zone component PRESENT. Diagnosis: NEGATIVE FOR INTRAEPITHELIAL LESIONS OR MALIGNANCY. Diagnosis: FUNGAL ORGANISMS PRESENT CONSISTENT WITH CANDIDA SPP. Material Submitted: CervicoVaginal Pap [ThinPrep Imaged]   02/01/2016 Pap IG, CT/NG w/ reflex HPV when ASC-U Specimen adequacy:: SEE NOTE FINAL DIAGNOSIS:: SEE NOTE COMMENTS:: SEE NOTE Cytotechnologist:: SEE NOTE Chlamydia Probe Amp: NOT DETECTED GC Probe Amp: NOT DETECTED    Health Maintenance Due  Topic Date Due   Hepatitis C Screening  Never done   Pneumococcal Vaccine 63-56 Years old (1 of 2 - PCV) Never done   DTaP/Tdap/Td (2 - Td or Tdap) 02/03/2019   Cervical Cancer Screening (HPV/Pap Cotest)  09/22/2021   COVID-19 Vaccine (1 - 2024-25 season) Never done   The following portions of the patient's history were reviewed and updated as appropriate: allergies, current medications, past family history, past medical history, past social history, past surgical history and problem list. Problem list updated.  Objective:   Vitals:   04/08/24 1334  BP: 104/63  Pulse: 64  Weight: 216 lb 12.8 oz (98.3 kg)   Physical Exam Vitals and nursing note reviewed. Exam conducted with a chaperone present Film/video editor).  Constitutional:      General: She is not in acute distress.  Appearance: Normal appearance. She is obese. She is not ill-appearing, toxic-appearing or diaphoretic.  HENT:     Head: Normocephalic and atraumatic.     Mouth/Throat:     Mouth: Mucous membranes are moist.     Pharynx: Oropharynx is clear. No oropharyngeal exudate or posterior oropharyngeal erythema.  Eyes:     General: No scleral icterus.        Right eye: No discharge.        Left eye: No discharge.     Conjunctiva/sclera: Conjunctivae normal.  Cardiovascular:     Rate and Rhythm: Normal rate and regular rhythm.     Heart sounds: Normal heart sounds. No murmur heard.    No gallop.  Pulmonary:     Effort: Pulmonary effort is normal.  Abdominal:     General: Abdomen is flat.     Palpations: Abdomen is soft. There is no mass.     Tenderness: There is no abdominal tenderness. There is no rebound.  Genitourinary:    General: Normal vulva.     Exam position: Lithotomy position.     Pubic Area: No rash or pubic lice.      Tanner stage (genital): 5.     Labia:        Right: No rash or lesion.        Left: No rash or lesion.      Vagina: Normal. No vaginal discharge, erythema, bleeding or lesions.     Cervix: Normal. No cervical motion tenderness, discharge, friability, lesion or erythema.     Uterus: Normal.      Adnexa: Right adnexa normal and left adnexa normal.     Comments: pH = 3 Musculoskeletal:        General: Normal range of motion.     Cervical back: Neck supple. No rigidity or tenderness.  Lymphadenopathy:     Head:     Right side of head: No preauricular or posterior auricular adenopathy.     Left side of head: No preauricular or posterior auricular adenopathy.     Cervical: Cervical adenopathy present.     Right cervical: No superficial or posterior cervical adenopathy.    Left cervical: No superficial or posterior cervical adenopathy.     Upper Body:     Right upper body: No supraclavicular adenopathy.     Left upper body: No supraclavicular adenopathy.  Skin:    General: Skin is warm and dry.     Capillary Refill: Capillary refill takes less than 2 seconds.     Coloration: Skin is not jaundiced.     Findings: No bruising, erythema, lesion or rash.  Neurological:     General: No focal deficit present.     Mental Status: She is alert and oriented to person, place, and time.  Psychiatric:        Mood and  Affect: Mood normal.        Behavior: Behavior normal.    Assessment and Plan:  Linda Reyes is a 33 y.o. female G1P1001 presenting to the [facility] Department for an yearly wellness and contraception visit  Contraception counseling: Reviewed options based on patient desire and reproductive life plan. Patient is interested in Hormonal Injection. This was provided to the patient today.   Risks, benefits, and typical effectiveness rates were reviewed.  Questions were answered.  Written information was also given to the patient to review.    The patient will follow up in  3 months for surveillance.  The patient  was told to call with any further questions, or with any concerns about this method of contraception.  Emphasized use of condoms 100% of the time for STI prevention.  Educated on ECP and assessed need for ECP. Patient did not qualify.   Encounter for surveillance of injectable contraceptive -     Pregnancy, urine -     medroxyPROGESTERone  Acetate  Cervical cancer screening -     IGP, Aptima HPV  Screening examination for venereal disease -     WET PREP FOR TRICH, YEAST, CLUE -     Chlamydia/Gonorrhea [place] [facility] -     Syphilis Serology, [place] [facility] -     HIV  LAB   No follow-ups on file.  Future Appointments  Date Time Provider Department Center  04/10/2024  2:15 PM [facility] NURSE [facility] None  04/13/2024  1:15 PM [facility] NURSE [facility] None   Jack Marts, MD

## 2024-04-08 NOTE — Progress Notes (Signed)
 Patient is here for PE and Depo injection. FP packet given to patient and contents reviewed with the patient. Depo injection given at the Rt deltoid and pt tolerated well to injection. Wet prep results reviewed with pt, no treatment required per provider. Condoms declined, patient given the opportunity to ask questions for any clarifications. Austine Lefort, RN.

## 2024-04-08 NOTE — Patient Instructions (Signed)
 Depo Provera  Today we gave you the injectable birth control called Depo Provera .  Please come back in 11-12 weeks for your next injection.   PAP Smear Today we performed a PAP smear to screen for cervical cancer. The results should be back in 1-2 weeks.  Once we have the results we can determine when your next screening should be.    STI screening - Today we obtained a vaginal swab to screen for gonorrhea, chlamydia, and trichomonas - We also obtained a blood sample to screen for HIV and syphilis - If the results are abnormal, I will give you a call.    Estimated time frame for results collected at the [facility] Department: Same day Trichomonas Yeast BV (bacterial vaginosis)  Within 1-2 weeks Gonorrhea Chlamydia  Within 2-3 weeks HIV Syphilis Hepatitis B Hepatitis C

## 2024-04-10 ENCOUNTER — Other Ambulatory Visit

## 2024-04-13 ENCOUNTER — Other Ambulatory Visit

## 2024-04-16 LAB — IGP, APTIMA HPV
HPV Aptima: NEGATIVE
PAP Smear Comment: 0

## 2024-04-17 ENCOUNTER — Encounter: Payer: Self-pay | Admitting: Family Medicine

## 2024-04-20 ENCOUNTER — Other Ambulatory Visit
